# Patient Record
Sex: Female | Born: 1965 | Race: White | Hispanic: No | Marital: Married | Smoking: Current every day smoker
Health system: Southern US, Community
[De-identification: ages and names within clinical notes are randomized; demographics above are authoritative.]

## PROBLEM LIST (undated history)

## (undated) ENCOUNTER — Emergency Department (HOSPITAL_BASED_OUTPATIENT_CLINIC_OR_DEPARTMENT_OTHER): Payer: 59

## (undated) DIAGNOSIS — E559 Vitamin D deficiency, unspecified: Secondary | ICD-10-CM

## (undated) DIAGNOSIS — K648 Other hemorrhoids: Secondary | ICD-10-CM

## (undated) DIAGNOSIS — K824 Cholesterolosis of gallbladder: Secondary | ICD-10-CM

## (undated) DIAGNOSIS — K6289 Other specified diseases of anus and rectum: Secondary | ICD-10-CM

## (undated) DIAGNOSIS — Z8719 Personal history of other diseases of the digestive system: Secondary | ICD-10-CM

## (undated) DIAGNOSIS — N281 Cyst of kidney, acquired: Secondary | ICD-10-CM

## (undated) DIAGNOSIS — K219 Gastro-esophageal reflux disease without esophagitis: Secondary | ICD-10-CM

## (undated) DIAGNOSIS — E785 Hyperlipidemia, unspecified: Secondary | ICD-10-CM

## (undated) DIAGNOSIS — F419 Anxiety disorder, unspecified: Secondary | ICD-10-CM

## (undated) DIAGNOSIS — K5790 Diverticulosis of intestine, part unspecified, without perforation or abscess without bleeding: Secondary | ICD-10-CM

## (undated) DIAGNOSIS — I700 Atherosclerosis of aorta: Secondary | ICD-10-CM

## (undated) DIAGNOSIS — IMO0002 Reserved for concepts with insufficient information to code with codable children: Secondary | ICD-10-CM

## (undated) HISTORY — DX: Vitamin D deficiency, unspecified: E55.9

## (undated) HISTORY — DX: Cyst of kidney, acquired: N28.1

## (undated) HISTORY — DX: Personal history of other diseases of the digestive system: Z87.19

## (undated) HISTORY — DX: Reserved for concepts with insufficient information to code with codable children: IMO0002

## (undated) HISTORY — DX: Gastro-esophageal reflux disease without esophagitis: K21.9

## (undated) HISTORY — DX: Cholesterolosis of gallbladder: K82.4

## (undated) HISTORY — DX: Diverticulosis of intestine, part unspecified, without perforation or abscess without bleeding: K57.90

## (undated) HISTORY — PX: UPPER GASTROINTESTINAL ENDOSCOPY: SHX188

## (undated) HISTORY — DX: Other specified diseases of anus and rectum: K62.89

## (undated) HISTORY — PX: LUMBAR DISC SURGERY: SHX700

## (undated) HISTORY — DX: Atherosclerosis of aorta: I70.0

## (undated) HISTORY — DX: Anxiety disorder, unspecified: F41.9

## (undated) HISTORY — DX: Other hemorrhoids: K64.8

## (undated) HISTORY — DX: Hyperlipidemia, unspecified: E78.5

---

## 1998-09-25 ENCOUNTER — Encounter: Payer: Self-pay | Admitting: Internal Medicine

## 1998-09-25 ENCOUNTER — Ambulatory Visit (HOSPITAL_COMMUNITY): Admission: RE | Admit: 1998-09-25 | Discharge: 1998-09-25 | Payer: Self-pay | Admitting: Internal Medicine

## 1999-10-18 ENCOUNTER — Other Ambulatory Visit: Admission: RE | Admit: 1999-10-18 | Discharge: 1999-10-18 | Payer: Self-pay | Admitting: Obstetrics and Gynecology

## 2001-03-25 ENCOUNTER — Other Ambulatory Visit: Admission: RE | Admit: 2001-03-25 | Discharge: 2001-03-25 | Payer: Self-pay | Admitting: Obstetrics and Gynecology

## 2004-01-09 ENCOUNTER — Ambulatory Visit (HOSPITAL_COMMUNITY): Admission: RE | Admit: 2004-01-09 | Discharge: 2004-01-09 | Payer: Self-pay | Admitting: Obstetrics and Gynecology

## 2005-01-18 ENCOUNTER — Ambulatory Visit (HOSPITAL_COMMUNITY): Admission: RE | Admit: 2005-01-18 | Discharge: 2005-01-18 | Payer: Self-pay | Admitting: Obstetrics and Gynecology

## 2005-07-08 ENCOUNTER — Ambulatory Visit (HOSPITAL_COMMUNITY): Admission: RE | Admit: 2005-07-08 | Discharge: 2005-07-09 | Payer: Self-pay | Admitting: Neurosurgery

## 2008-07-26 ENCOUNTER — Emergency Department (HOSPITAL_BASED_OUTPATIENT_CLINIC_OR_DEPARTMENT_OTHER): Admission: EM | Admit: 2008-07-26 | Discharge: 2008-07-26 | Payer: Self-pay | Admitting: Emergency Medicine

## 2009-06-26 ENCOUNTER — Encounter: Payer: Self-pay | Admitting: Gastroenterology

## 2009-07-05 ENCOUNTER — Encounter: Payer: Self-pay | Admitting: Gastroenterology

## 2009-07-05 ENCOUNTER — Ambulatory Visit (HOSPITAL_COMMUNITY): Admission: RE | Admit: 2009-07-05 | Discharge: 2009-07-05 | Payer: Self-pay | Admitting: Internal Medicine

## 2009-07-28 ENCOUNTER — Ambulatory Visit: Payer: Self-pay | Admitting: Gastroenterology

## 2009-07-28 DIAGNOSIS — R197 Diarrhea, unspecified: Secondary | ICD-10-CM | POA: Insufficient documentation

## 2009-08-10 ENCOUNTER — Encounter (INDEPENDENT_AMBULATORY_CARE_PROVIDER_SITE_OTHER): Payer: Self-pay

## 2009-08-18 ENCOUNTER — Ambulatory Visit: Payer: Self-pay | Admitting: Gastroenterology

## 2009-08-18 LAB — CONVERTED CEMR LAB
Fecal Occult Blood: NEGATIVE
OCCULT 1: NEGATIVE
OCCULT 2: NEGATIVE
OCCULT 3: NEGATIVE
OCCULT 4: NEGATIVE
OCCULT 5: NEGATIVE

## 2009-08-23 ENCOUNTER — Ambulatory Visit: Payer: Self-pay | Admitting: Gastroenterology

## 2009-12-05 ENCOUNTER — Encounter: Admission: RE | Admit: 2009-12-05 | Discharge: 2009-12-05 | Payer: Self-pay | Admitting: Specialist

## 2010-11-13 NOTE — Letter (Signed)
Summary: CMA Hemoccult Letter  [facility]  [address] [place], [place] [address]   Phone: [phone]  Fax: [phone]         August 10, 2009 MRN: [id number]    [facility] [address] [place], [place]  [address]    Dear Ms. Janosik,     Despite repeated attempts, I have been unable to reach you by phone. At your last visit, Dr Russella Dar requested that you complete the hemoccult cards given to you at your last visit. However, as of yet, we have not received them. Please follow the instructions on the inside cover and return them as soon as possible.If you have misplaced the hemoccult cards, please call me at [phone] and I will mail you new cards. Your health is very important to [place].These tests will help ensure that Dr. Russella Dar has all the information at his disposal to make a complete diagnosis for you.  Thank you for your prompt attention to this matter.   Sincerely,    Darcey Nora RN, CGRN

## 2010-11-13 NOTE — Assessment & Plan Note (Signed)
Summary: f/u diarrhea   History of Present Illness Visit Type: Follow-up Visit Primary GI MD: Elie Goody MD [facility] Primary Provider: Shela Nevin Requesting Provider: Shela Nevin Chief Complaint: diarrhea, some improvement History of Present Illness:   Laura Kelley returns for followup today, with a substantial improvement in her diarrhea after completing a course of Cipro and Flagyl. She now reports 1-2 semi-formed stools per day. She feels she is under significant stress caring for both her parents with chronic illnesses, and when traveling on a weekend did get away this weekend with her husband. She noted mild constipation. She notes that her appetite and weight had stabilized. She relates that her brother has hemochromatosis. She has twins sisters who are carriers for the gene. She states she had genetic testing performed, which showed she does not carry a copy of the abnormal gene.   GI Review of Systems      Denies abdominal pain, acid reflux, belching, bloating, chest pain, dysphagia with liquids, dysphagia with solids, heartburn, loss of appetite, nausea, vomiting, vomiting blood, weight loss, and  weight gain.      Reports diarrhea.     Denies anal fissure, black tarry stools, change in bowel habit, constipation, diverticulosis, fecal incontinence, heme positive stool, hemorrhoids, irritable bowel syndrome, jaundice, light color stool, liver problems, rectal bleeding, and  rectal pain.   Current Medications (verified): 1)  None  Allergies (verified): No Known Drug Allergies  Past History:  Past Medical History: Reviewed history from 07/28/2009 and no changes required. Sleep Apnea  Past Surgical History: Reviewed history from 07/28/2009 and no changes required. 2 c-sections lower back surgery  Family History: Reviewed history from 07/28/2009 and no changes required. Family History of Ovarian Cancer:aunt No FH of Colon Cancer: Brother with  hemochromatosis  Social History: Reviewed history from 07/28/2009 and no changes required. married 2 children Occupation: Air cabin crew Patient currently smokes. 10 ciggs per day Alcohol Use - yes  2 weekly Daily Caffeine Use 2 cups coffee per day Illicit Drug Use - no  Review of Systems       The patient complains of allergy/sinus, depression-new, fatigue, headaches-new, menstrual pain, night sweats, and sleeping problems.         The pertinent positives and negatives are noted as above and in the HPI. All other ROS were reviewed and were negative.   Vital Signs:  Patient profile:   45 year old female Height:      60 inches Weight:      164.50 pounds BMI:     32.24 Pulse rate:   60 / minute Pulse rhythm:   regular BP sitting:   100 / 68  (left arm) Cuff size:   regular  Vitals Entered By: June McMurray CMA Duncan Dull) (August 23, 2009 8:34 AM)  Physical Exam  General:  Well developed, well nourished, no acute distress. Head:  Normocephalic and atraumatic. Eyes:  PERRLA, no icterus. Mouth:  No deformity or lesions, dentition normal. Lungs:  Clear throughout to auscultation. Heart:  Regular rate and rhythm; no murmurs, rubs,  or bruits. Abdomen:  Soft, nontender and nondistended. No masses, hepatosplenomegaly or hernias noted. Normal bowel sounds. Psych:  Alert and cooperative. Normal mood and affect.  Impression & Recommendations:  Problem # 1:  DIARRHEA (ICD-787.91) Her diarrhea has essentially resolved. Probiotic for one month. A component of her diarrhea may be related to stress. If her diarrhea or weight loss persists she will return for further evaluation to include colonoscopy.  Problem #  2:  LOSS OF WEIGHT (ICD-783.21) Her weight is stabilized.  Patient Instructions: 1)  Please take Align 1 tablet daily. (You have been given samples) 2)  Follow up as needed for your gastroentestinal concerns. 3)  Copy sent to : Lucky Cowboy, MD 4)  The medication list  was reviewed and reconciled.  All changed / newly prescribed medications were explained.  A complete medication list was provided to the patient / caregiver.  Appended Document: f/u diarrhea    Clinical Lists Changes  Observations: Added new observation of COLONNXTDUE: 01/12/2014 (08/23/2009 9:15)

## 2010-11-13 NOTE — Assessment & Plan Note (Signed)
Summary: CHRONIC DIARRHEA/YF   History of Present Illness Visit Type: Initial Consult Primary GI MD: Elie Goody MD [facility] Primary Provider: Shela Nevin Requesting Provider: Shela Nevin Chief Complaint: Chronic diarrhea x 4 months, neg for blood History of Present Illness:   This is a 45 year old female who relates a 4 month history of diarrhea. She relates nonbloody, loose stools occurring between 1 and 5 times a day. Symptoms began at the same time she has had increased stress in her life. She notes generalized crampy, abdominal pain that is relieved by diarrhea. She states she had blood work performed Dr. Kathryne Sharper office that was unremarkable, including a celiac panel. She recently had ultrasound showed small uterine fibroids, but no other abnormalities. She notes about a 10 pound weight loss over the past 4 months. She tried a lactose-free diet for 2 weeks with no improvement in symptoms. She cannot correlate any specific food to her symptoms. She denies any recent antibiotic usage. She has rare episodes of acid reflux, which are related to being late at night.   GI Review of Systems    Reports abdominal pain, acid reflux, belching, bloating, heartburn, nausea, and  weight loss.     Location of  Abdominal pain: generalized. Weight loss of 10 pounds   Denies chest pain, dysphagia with liquids, dysphagia with solids, loss of appetite, vomiting, vomiting blood, and  weight gain.      Reports change in bowel habits and  diarrhea.     Denies anal fissure, black tarry stools, constipation, diverticulosis, fecal incontinence, heme positive stool, hemorrhoids, irritable bowel syndrome, jaundice, light color stool, liver problems, rectal bleeding, and  rectal pain.   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 10 ciggs per day Alcohol Use - yes  2 weekly Daily Caffeine Use 2 cups coffee per day Illicit Drug Use - no Smoking Status:  current Drug Use:  no  Review of Systems       The patient complains of allergy/sinus, anxiety-new, depression-new, fatigue, menstrual pain, muscle pains/cramps, night sweats, and sleeping problems.         The pertinent positives and negatives are noted as above and in the HPI. All other ROS were reviewed and were negative.   Vital Signs:  Patient profile:   45 year old female Height:      60 inches Weight:      162 pounds BMI:     31.75 BSA:     1.71 Pulse rate:   100 / minute Pulse rhythm:   regular BP sitting:   112 / 78  (left arm)  Vitals Entered By: Merri Ray CMA (AAMA) (July 28, 2009 9:16 AM)  Physical Exam  General:  Well developed, well nourished, no acute distress. Head:  Normocephalic and atraumatic. Eyes:  PERRLA, no icterus. Ears:  Normal auditory acuity. Mouth:  No deformity or lesions, dentition normal. Neck:  Supple; no masses or thyromegaly. Lungs:  Clear throughout to auscultation. Heart:  Regular rate and rhythm; no murmurs, rubs,  or bruits. Abdomen:  Soft, nontender and nondistended. No masses, hepatosplenomegaly or hernias noted. Normal bowel sounds. Rectal:  deferred until time of colonoscopy.   Msk:  Symmetrical with no gross deformities.  Normal posture. Pulses:  Normal pulses noted. Extremities:  No clubbing, cyanosis, edema or deformities noted. Neurologic:  Alert and  oriented x4;  grossly normal neurologically. Cervical Nodes:  No significant cervical adenopathy. Inguinal Nodes:  No significant inguinal adenopathy. Psych:  Alert and cooperative. Normal mood and affect.  Impression & Recommendations:  Problem # 1:  DIARRHEA  (ICD-787.91) Chronic diarrhea with a variable pattern. This has been associated with weight loss an increased stress. Rule out irritable bowel syndrome, inflammatory bowel disease, colorectal neoplasms, and chronic intestinal infections. Obtain blood work from Dr. Kathryne Sharper office. Trial of Cipro and Flagyl for 7 days. If her symptoms do not completely respond, proceed with colonoscopy for further evaluation. Orders: [facility] Hemoccult Cards #3 (take home) (Hem cards #3)  Problem # 2:  LOSS OF WEIGHT (ICD-783.21) As above. Orders: [facility] Hemoccult Cards #3 (take home) (Hem cards #3)  Patient Instructions: 1)  Follow instructions on Hemoccult cards and mail back to [place].  2)  Pick up prescriptions at your pharmacy.  3)  Please schedule a follow-up appointment in 2 weeks.  4)  Copy sent to : Lucky Cowboy, MD 5)  The medication list was reviewed and reconciled.  All changed / newly prescribed medications were explained.  A complete medication list was provided to the patient / caregiver.  Prescriptions: FLAGYL 500 MG TABS (METRONIDAZOLE) one tablet by mouth two times a day  #14 x 0   Entered by:   Christie Nottingham CMA (AAMA)   Authorized by:   Meryl Dare MD [facility]   Signed by:   Meryl Dare MD FACG on 07/28/2009   Method used:   Electronically to        [facility] [phone]* (retail)       [address]       [place], [place]  [address]       Ph: [phone] or [phone]       Fax: [phone]   RxID:   [id number] CIPRO 500 MG TABS (CIPROFLOXACIN HCL) one tablet by mouth two times a day  #14 x 0   Entered by:   Christie Nottingham CMA (AAMA)   Authorized by:   Meryl Dare MD [facility]   Signed by:   Meryl Dare MD FACG on 07/28/2009   Method used:   Electronically to        [phone][facility] (retail)       [address]       [place], [place]  [address]       Ph: [phone] or [phone]       Fax: [phone]   RxID:   [id number]

## 2011-03-01 NOTE — Op Note (Signed)
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   OPERATION PERFORMED:  Right L3 hemilaminotomy and microdiskectomy with  microdissection.   SURGEON:  Hewitt Shorts, M.D.   ASSISTANT:  Danae Orleans. Venetia Maxon, M.D.   ANESTHESIA:  General endotracheal.   INDICATIONS FOR PROCEDURE:  The patient is a 45 year old woman who presented  with a right L3 radiculopathy with weakness of the right iliopsoas and  quadriceps muscle who was found to have an L3-4 disk herniation that had  migrated rostrally behind the body of L3.  Decision was made to proceed with  elective laminotomy and microdiskectomy.   DESCRIPTION OF PROCEDURE:  The patient was brought to the operating room and  placed under general endotracheal anesthesia.  The patient was turned to a  prone position.  Lumbar region was prepped with Betadine soap and solution  and draped in sterile fashion.  The midline was infiltrated with local  anesthetic with epinephrine.  X-ray was taken, the L3-4 level identified and  a midline incision was made and carried down to the subcutaneous tissue.  Bipolar cautery and electrocautery were used to maintain hemostasis.  Dissection was carried down to the lumbar fascia which was incised on the  right side of the midline in the paraspinal muscles.  We dissected the  spinous process and lamina in  subperiosteal fashion.  A number of x-rays  were taken and we identified the L3 lamina as well as the L2-3 and L3-4  interlaminar spaces.  The operating microscope was draped and brought into  the field to provide additional magnification, illumination and  visualization and the decompression was performed using microdissection and  microsurgical technique. A hemilaminotomy on the right side of L3 was  performed using the XMax drill and Kerrison punches.  The ligamentum flavum  was removed and we identified the thecal sac and exiting nerve roots.  Specifically we identified the exiting right L3 and L4 nerve roots.  We then  gently retracted the thecal sac medially exposing the L3-4 disk annulus.  We  identified the L3 nerve roots and the pedicle of L3 and examined the  epidural space.  There appeared to be a subligamentous disk herniation  present.  We incised the remaining annular fibers and proceeded with a  diskectomy using a variety of pituitary rongeurs and microcurettes.  There  was spondylitic overgrowth on the posterior aspect of the L3-4 disk level  and this was removed using an osteophyte removal tool.  Also the ligamentum  flavum was thickened in the  medial aspect of the neural foramen and this was  carefully removed as well.  We were able to decompress the spinal canal,  epidural space as well as the neural foramen and specifically decompress the  exiting L3 and L4 nerve roots as well as the thecal sac.  In the end, all  loose fragments of disk material were removed from both the disk space and  the epidural space.  Once the decompression was completed, hemostasis was  established with the use of Gelfoam soaked in thrombin.  All the Gelfoam was  removed prior to closure.  We then instilled 2 mL of fentanyl and 80 mg of  DepoMedrol.  However, we actually had to aspirate  much of that back up  because there were small points of bleeding which was coagulated.  We then  proceeded  with closure.  The deep fascia was closed with interrupted #1  undyed Vicryl sutures, Scarpa's fascia was closed with interrupted undyed #1  Vicryl sutures, the subcutaneous and subcuticular layer were closed with  interrupted inverted 2-0 undyed Vicryl sutures and skin edges closed with  Dermabond.  The patient tolerated the procedure well.  The estimated blood  loss for this procedure was 50 mL.  Sponge, needle and instrument counts  were correct.  Following surgery the patient was to be turned back to supine  position to be reversed from anesthetic, extubated and transferred to  recovery room for further care.      Hewitt Shorts, M.D.  Electronically Signed     RWN/MEDQ  D:  07/08/2005  T:  07/09/2005  Job:  [id number]

## 2011-07-16 LAB — COMPREHENSIVE METABOLIC PANEL
ALT: 6
AST: 17
Albumin: 3.9
Alkaline Phosphatase: 76
BUN: 11
CO2: 22
Calcium: 8.7
Chloride: 110
Creatinine, Ser: 0.6
GFR calc Af Amer: >60
GFR calc non Af Amer: >60
Glucose, Bld: 80
Potassium: 4.1
Sodium: 139
Total Bilirubin: 0.3
Total Protein: 6.7

## 2011-07-16 LAB — DIFFERENTIAL
Basophils Absolute: 0
Basophils Relative: 0
Eosinophils Absolute: 0.3
Eosinophils Relative: 3
Lymphocytes Relative: 21
Lymphs Abs: 2.3
Monocytes Absolute: 0.8
Monocytes Relative: 8
Neutro Abs: 7.4
Neutrophils Relative %: 68

## 2011-07-16 LAB — URINALYSIS, ROUTINE W REFLEX MICROSCOPIC
Bilirubin Urine: NEGATIVE
Glucose, UA: NEGATIVE
Hgb urine dipstick: NEGATIVE
Ketones, ur: NEGATIVE
Nitrite: NEGATIVE
Protein, ur: NEGATIVE
Specific Gravity, Urine: 1.018
Urobilinogen, UA: 0.2
pH: 5

## 2011-07-16 LAB — CBC
HCT: 38.3
Hemoglobin: 12.8
MCHC: 33.4
MCV: 89.4
Platelets: 364
RBC: 4.29
RDW: 13.5
WBC: 10.8 — ABNORMAL HIGH

## 2011-07-16 LAB — LIPASE, BLOOD: Lipase: 103

## 2011-10-15 HISTORY — PX: COLONOSCOPY: SHX174

## 2012-05-06 ENCOUNTER — Ambulatory Visit (HOSPITAL_COMMUNITY)
Admission: RE | Admit: 2012-05-06 | Discharge: 2012-05-06 | Disposition: A | Discharge status: Home / Self Care | Payer: 59 | Source: Ambulatory Visit | Attending: Internal Medicine | Admitting: Internal Medicine

## 2012-05-06 ENCOUNTER — Other Ambulatory Visit (HOSPITAL_COMMUNITY): Payer: Self-pay | Admitting: Internal Medicine

## 2012-05-06 DIAGNOSIS — R1011 Right upper quadrant pain: Secondary | ICD-10-CM | POA: Insufficient documentation

## 2012-05-06 DIAGNOSIS — Q619 Cystic kidney disease, unspecified: Secondary | ICD-10-CM | POA: Insufficient documentation

## 2012-05-06 DIAGNOSIS — K824 Cholesterolosis of gallbladder: Secondary | ICD-10-CM | POA: Insufficient documentation

## 2012-05-06 DIAGNOSIS — K7689 Other specified diseases of liver: Secondary | ICD-10-CM | POA: Insufficient documentation

## 2012-05-07 ENCOUNTER — Telehealth: Payer: Self-pay | Admitting: Gastroenterology

## 2012-05-07 NOTE — Telephone Encounter (Signed)
Last OV with Dr Russella Dar 09/02/2009; no procedures in EPIC and I have sent for the chart.

## 2012-05-08 NOTE — Telephone Encounter (Signed)
Laura Kelley reports pt has severe abdominal pain that doubles her over. She does not want to go to the ER because she feels they will just send her home. Pt had an U/S on 05/06/12 that showed no gallstones, but with polyps. Pt given an appt 05/11/12 at 2:30am.

## 2012-05-11 ENCOUNTER — Encounter: Payer: Self-pay | Admitting: Nurse Practitioner

## 2012-05-11 ENCOUNTER — Ambulatory Visit (INDEPENDENT_AMBULATORY_CARE_PROVIDER_SITE_OTHER): Payer: 59 | Admitting: Nurse Practitioner

## 2012-05-11 VITALS — BP 120/70 | HR 75 | Ht 60.0 in | Wt 165.2 lb

## 2012-05-11 DIAGNOSIS — R198 Other specified symptoms and signs involving the digestive system and abdomen: Secondary | ICD-10-CM

## 2012-05-11 DIAGNOSIS — Z8371 Family history of colonic polyps: Secondary | ICD-10-CM

## 2012-05-11 DIAGNOSIS — R140 Abdominal distension (gaseous): Secondary | ICD-10-CM

## 2012-05-11 DIAGNOSIS — R141 Gas pain: Secondary | ICD-10-CM

## 2012-05-11 DIAGNOSIS — R1010 Upper abdominal pain, unspecified: Secondary | ICD-10-CM

## 2012-05-11 DIAGNOSIS — R143 Flatulence: Secondary | ICD-10-CM

## 2012-05-11 DIAGNOSIS — Z83719 Family history of colon polyps, unspecified: Secondary | ICD-10-CM | POA: Insufficient documentation

## 2012-05-11 DIAGNOSIS — R109 Unspecified abdominal pain: Secondary | ICD-10-CM

## 2012-05-11 MED ORDER — DICYCLOMINE HCL 10 MG PO CAPS: ORAL_CAPSULE | ORAL | Status: DC

## 2012-05-11 MED ORDER — MOVIPREP 100 G PO SOLR: 1.0000 | Freq: Once | ORAL | Status: AC

## 2012-05-11 MED ORDER — ESOMEPRAZOLE MAGNESIUM 40 MG PO PACK: 40.0000 mg | PACK | Freq: Every day | ORAL | Status: DC

## 2012-05-11 NOTE — Patient Instructions (Addendum)
We [facility] for Nexium, samples provided, and Bentyl ( Dicyclomine)  And the colonoscopy prep, Moviprep. We scheduled the colonoscopy on 06-16-2012. Directions provided.  Colonoscopy A colonoscopy is an exam to evaluate your entire colon. In this exam, your colon is cleansed. A long fiberoptic tube is inserted through your rectum and into your colon. The fiberoptic scope (endoscope) is a long bundle of enclosed and very flexible fibers. These fibers transmit light to the area examined and send images from that area to your caregiver. Discomfort is usually minimal. You may be given a drug to help you sleep (sedative) during or prior to the procedure. This exam helps to detect lumps (tumors), polyps, inflammation, and areas of bleeding. Your caregiver may also take a small piece of tissue (biopsy) that will be examined under a microscope. LET YOUR CAREGIVER KNOW ABOUT:   Allergies to food or medicine.   Medicines taken, including vitamins, herbs, eyedrops, over-the-counter medicines, and creams.   Use of steroids (by mouth or creams).   Previous problems with anesthetics or numbing medicines.   History of bleeding problems or blood clots.   Previous surgery.   Other health problems, including diabetes and kidney problems.   Possibility of pregnancy, if this applies.  BEFORE THE PROCEDURE   A clear liquid diet may be required for 2 days before the exam.   Ask your caregiver about changing or stopping your regular medications.   Liquid injections (enemas) or laxatives may be required.   A large amount of electrolyte solution may be given to you to drink over a short period of time. This solution is used to clean out your colon.   You should be present 60 minutes prior to your procedure or as directed by your caregiver.  AFTER THE PROCEDURE   If you received a sedative or pain relieving medication, you will need to arrange for someone to drive you home.    Occasionally, there is a little blood passed with the first bowel movement. Do not be concerned.  FINDING OUT THE RESULTS OF YOUR TEST Not all test results are available during your visit. If your test results are not back during the visit, make an appointment with your caregiver to find out the results. Do not assume everything is normal if you have not heard from your caregiver or the medical facility. It is important for you to follow up on all of your test results. HOME CARE INSTRUCTIONS   It is not unusual to pass moderate amounts of gas and experience mild abdominal cramping following the procedure. This is due to air being used to inflate your colon during the exam. Walking or a warm pack on your belly (abdomen) may help.   You may resume all normal meals and activities after sedatives and medicines have worn off.   Only take over-the-counter or prescription medicines for pain, discomfort, or fever as directed by your caregiver. Do not use aspirin or blood thinners if a biopsy was taken. Consult your caregiver for medicine usage if biopsies were taken.  SEEK IMMEDIATE MEDICAL CARE IF:   You have a fever.   You pass large blood clots or fill a toilet with blood following the procedure. This may also occur 10 to 14 days following the procedure. This is more likely if a biopsy was taken.   You develop abdominal pain that keeps getting worse and cannot be relieved with medicine.  Document Released: 09/27/2000 Document Revised: 09/19/2011 Document Reviewed: 05/12/2008  [employer] Patient Information 2012 [employer] [place].

## 2012-05-11 NOTE — Progress Notes (Signed)
05/11/2012 Laura Kelley [id number] 04-16-1966   HISTORY OF PRESENT ILLNESS:  Patient is a 46 year old female who was seen by Dr. Russella Dar in November 2010 for evaluation of diarrhea. Her symptoms improved on Cipro and Flagyl.  Patient worked in today for evaluation of bowel changes and abdominal pain. Patient has had chronic, painless diarrhea for years. Over the last two months she has had intermittent constipation and abdominal pain. She has an intermittent gnawing pain in RUQ which can radiate around to her mid back and seems to be worse during periods of constipation. She complains of gas and bloating. Patient stopped fatty foods several days ago and does feels better but difficult to sort out as pain has been intermittent anyway.  PCP got ultrasound which showed gallbladder polyps. No findings of cholecystitis. I reviewed labs from primary care physician's office drawn 05/07/12. CBC was normal with hemoglobin of 13 point 2. Electrolytes were normal. Liver function studies were normal.  History reviewed. No pertinent past medical history. Past Surgical History  Procedure Date  . Lumbar disc surgery     Dr. Bettina Gavia  . Cesarean section 1985, 1993    x 2    reports that she has been smoking Cigarettes.  She has a 8.5 pack-year smoking history. She has never used smokeless tobacco. She reports that she drinks alcohol. She reports that she does not use illicit drugs. family history includes Colon polyps in her mother and Multiple sclerosis in her mother.  There is no history of Colon cancer. Allergies  Allergen Reactions  . Other     Stitches used after growth on back removed, caused redness and itching      Outpatient Encounter Prescriptions as of 05/11/2012  Medication Sig Dispense Refill  . Ibuprofen 200 MG CAPS Take by mouth as needed.         REVIEW OF SYSTEMS  : Positive for sinus trouble and menstrual pain. All other systems reviewed and negative except where noted in the  History of Present Illness.   PHYSICAL EXAM: BP 120/70  Pulse 75  Ht 5' (1.524 m)  Wt 165 lb 3.2 oz (74.934 kg)  BMI 32.26 kg/m2  LMP 04/25/2012 General: Well developed white female in no acute distress Head: Normocephalic and atraumatic Eyes:  sclerae anicteric,conjunctive pink. Ears: Normal auditory acuity Mouth: No deformity or lesions Neck: Supple, no masses.  Lungs: Clear throughout to auscultation Heart: Regular rate and rhythm; no murmurs heard Abdomen: Soft, non distended, moderate RUQ tenderness, mild epigastric tenderness. No masses or hepatomegaly noted. Normal Bowel sounds Rectal: not done Musculoskeletal: Symmetrical with no gross deformities  Skin: No lesions on visible extremities Extremities: No edema or deformities noted Neurological: Alert oriented x 4, grossly nonfocal Cervical Nodes:  No significant cervical adenopathy Psychological:  Alert and cooperative. Normal mood and affect  ASSESSMENT AND PLAN:  1.  Intermittent radiating epigastric /right upper quadrant pain and bloating of at least two months duration. Pain seems to be better with elimination of fatty food. Patient has also had some associated bowel changes. She usually has chronic loose stool, now having some occasional constipation. Multiple GI issues, some of which sound like gallbladder disease though no evidence for gallstones or cholecystitis on ultrasound. Rule out peptic ulcer disease. First, for the the bloating and gas will try a course of align .Will add a daily PPI before breakfast. Patient will be scheduled for upper endoscopy to be done in 3-4 weeks' time. If she feels much better with the above  interventions then we can hold off on EGD. If no improvement in symptoms and upper endoscopy is negative, consider HIDA scan.   2. Bowel change, see #2. Patient has chronic loose stool, now having intermittent constipation. Suspect this is functional but will evaluate further at time of colonoscopy  (see #3) I don't think fiber would be a good idea at this time as it may exacerbate her bloating. Patient has developed right upper quadrant pain with these bowel changes. Will try Bentyl twice daily, cautioned her about the potential for constipation with this medication.   3. [facility] of colon polyps in mother. Patient told by mother's GI doctor that she should not wait until age 30 to get her screening colonoscopy done. It is reasonable to proceed with colonoscopy given family history of colon polyps as well as patient's recent bowel changes. The risks, benefits, and alternatives to colonoscopy with possible biopsy and possible polypectomy were discussed with the patient and they consent to proceed.   4. Abnormal ultrasound revealing gallbladder polyps, small   amount of debris,  no well defined gallstones. Normal LFTs

## 2012-05-11 NOTE — Progress Notes (Signed)
Reviewed and agree with management plans.  Fredi Hurtado T. Michiko Lineman MD FACG  

## 2012-05-12 ENCOUNTER — Encounter: Payer: Self-pay | Admitting: Gastroenterology

## 2012-06-01 ENCOUNTER — Other Ambulatory Visit (INDEPENDENT_AMBULATORY_CARE_PROVIDER_SITE_OTHER): Payer: 59

## 2012-06-01 ENCOUNTER — Telehealth: Payer: Self-pay | Admitting: Gastroenterology

## 2012-06-01 ENCOUNTER — Encounter: Payer: Self-pay | Admitting: Nurse Practitioner

## 2012-06-01 ENCOUNTER — Ambulatory Visit (INDEPENDENT_AMBULATORY_CARE_PROVIDER_SITE_OTHER): Payer: 59 | Admitting: Nurse Practitioner

## 2012-06-01 VITALS — BP 140/60 | HR 133 | Ht 60.0 in | Wt 158.8 lb

## 2012-06-01 DIAGNOSIS — R140 Abdominal distension (gaseous): Secondary | ICD-10-CM

## 2012-06-01 DIAGNOSIS — R109 Unspecified abdominal pain: Secondary | ICD-10-CM

## 2012-06-01 DIAGNOSIS — R141 Gas pain: Secondary | ICD-10-CM

## 2012-06-01 DIAGNOSIS — Z8371 Family history of colonic polyps: Secondary | ICD-10-CM

## 2012-06-01 DIAGNOSIS — R142 Eructation: Secondary | ICD-10-CM

## 2012-06-01 DIAGNOSIS — R198 Other specified symptoms and signs involving the digestive system and abdomen: Secondary | ICD-10-CM

## 2012-06-01 DIAGNOSIS — R1010 Upper abdominal pain, unspecified: Secondary | ICD-10-CM

## 2012-06-01 LAB — CBC
HCT: 41.6 % (ref 36.0–46.0)
Hemoglobin: 13.7 g/dL (ref 12.0–15.0)
MCHC: 32.9 g/dL (ref 30.0–36.0)
MCV: 92.1 fl (ref 78.0–100.0)
Platelets: 395 10*3/uL (ref 150.0–400.0)
RBC: 4.52 Mil/uL (ref 3.87–5.11)
RDW: 15.2 % — ABNORMAL HIGH (ref 11.5–14.6)
WBC: 12.4 10*3/uL — ABNORMAL HIGH (ref 4.5–10.5)

## 2012-06-01 LAB — HEPATIC FUNCTION PANEL
ALT: 12 U/L (ref 0–35)
AST: 13 U/L (ref 0–37)
Albumin: 4.2 g/dL (ref 3.5–5.2)
Alkaline Phosphatase: 70 U/L (ref 39–117)
Bilirubin, Direct: 0.1 mg/dL (ref 0.0–0.3)
Total Bilirubin: 0.5 mg/dL (ref 0.3–1.2)
Total Protein: 7.3 g/dL (ref 6.0–8.3)

## 2012-06-01 MED ORDER — TRAMADOL HCL 50 MG PO TABS: 50.0000 mg | ORAL_TABLET | Freq: Four times a day (QID) | ORAL | Status: AC | PRN

## 2012-06-01 MED ORDER — ESOMEPRAZOLE MAGNESIUM 40 MG PO PACK: 40.0000 mg | PACK | Freq: Every day | ORAL | Status: DC

## 2012-06-01 NOTE — Telephone Encounter (Signed)
She needs evaluation in ED, urgent care center or in office today with [place] or PCP. OK to use Bentyl for now and clear liquids.

## 2012-06-01 NOTE — Telephone Encounter (Signed)
Patient reports a T max of 99, chills and cold.  She reports since Friday she has had abdominal pain "right underneath my right rib cage".  She reports that she also has back pain and nausea. The pain doubles her over.  She reports that pain started late Friday night and lasted until 3:00 am on Sat and returned at 6:00 on Sat at about 6 am.  She states that eating aggravates her symptoms and causes severe pain.  She was prescribed dicyclomine at her last office visit with Willette Cluster RNP she has not tried this. I have asked her to take some now until I can have this reviewed by the MD.  "She keeps asking do I just need to go on to the ER?  Dr. Russella Dar please review and advise

## 2012-06-01 NOTE — Telephone Encounter (Signed)
Patient will come in and see Willette Cluster RNP today at 2:30

## 2012-06-01 NOTE — Patient Instructions (Addendum)
We have sent in your medication to your pharmacy We have given you Carafate samples to take four times a day. Before meals and at bedtime Increase your Nexium to twice a day. We scheduled the Endoscopy at [facility] with Dr. Melene Muller for Wed. 06-03-2012. Directions provided.

## 2012-06-02 ENCOUNTER — Encounter: Payer: Self-pay | Admitting: Nurse Practitioner

## 2012-06-02 NOTE — Progress Notes (Signed)
Laura Kelley [id number] 13-Oct-1966   HISTORY OR PRESENT ILLNESS :  Laura Kelley is a 46 year old female who I saw in late July of this year for abdominal pain and bowel changes. Patient had a history of chronic diarrhea but was reporting constipation and right upper quadrant pain when I saw her in July. An ultrasound showed gallbladder polyps, some debris but no definite stones. Labs drawn her primary cares office late July were normal. Patient's right upper quadrant discomfort was suspicious for gallbladder disease, especially since it was worse with fatty food intake. Prior to further gallbladder workup however, patient was scheduled for upper endoscopy to rule out other etiologies of pain such as peptic ulcer disease. She was given a PPI to try and scheduled for upper endoscopy to be done sometime in September. She was also given a trial of Bentyl for pain.  Neither the PPI nor Bentyl helped. Patient has continued to have intermittent right upper quadrant pain which has now begun radiating through to her right upper back. Over the weekend she had low grade fevers. Her weight is down about 7 pounds.  Current Medications, Allergies, Past Medical History, Past Surgical History, Family History and Social History were reviewed in [employer] record.   PHYSICAL EXAMINATION : General:  Well developed  female in no acute distress Head: Normocephalic and atraumatic Eyes:  sclerae anicteric,conjunctive pink. Ears: Normal auditory acuity Neck: Supple, no masses.  Lungs: Clear throughout to auscultation Heart: Regular rate and rhythm; no murmurs heard Abdomen: Soft, nondistended, moderate right upper quadrant tenderness . No masses or hepatomegaly noted. Normal bowel sounds Musculoskeletal: Symmetrical with no gross deformities  Skin: No lesions on visible extremities Extremities: No edema or deformities noted Neurological: Oriented x 4, grossly nonfocal Cervical Nodes:  No  significant cervical adenopathy Psychological:  Alert and cooperative. Normal mood and affect  ASSESSMENT AND PLAN :   1. Progressive right upper quadrant pain, now radiating through to her right upper back. She has been having low-grade fevers for the last couple of days. For unclear reasons this pain seems to be worse during times of constipation. I am still concerned about the possibility of gallbladder disease. Patient is scheduled for upper endoscopy in a few weeks, I have moved her procedure to this week.  In the meantime, she will take twice daily PPI and I gave her some samples of Carafate to try 4 times daily as well. If upper endoscopy is normal, consider HIDA scan and/ or referral to surgery for further evaluation of gallbladder.  2. family history of colon polyps. No blood in stool, she is not anemic.  Patient was to have a colonoscopy at the time of her upper endoscopy next month. It has become necessary to proceed earlier with upper endoscopy, her colonoscopy date will remain the same.  3. Bowel change, intermittent constipation. Proceed with colonoscopy 06/16/12 as previously planned for evaluation of bowel change and family history of colon polyps.

## 2012-06-03 ENCOUNTER — Ambulatory Visit (HOSPITAL_COMMUNITY)
Admission: RE | Admit: 2012-06-03 | Discharge: 2012-06-03 | Disposition: A | Discharge status: Home / Self Care | Payer: 59 | Source: Ambulatory Visit | Attending: Gastroenterology | Admitting: Gastroenterology

## 2012-06-03 ENCOUNTER — Encounter (HOSPITAL_COMMUNITY)
Admission: RE | Disposition: A | Discharge status: Home / Self Care | Payer: Self-pay | Source: Ambulatory Visit | Attending: Gastroenterology

## 2012-06-03 ENCOUNTER — Encounter (HOSPITAL_COMMUNITY): Payer: Self-pay | Admitting: Gastroenterology

## 2012-06-03 DIAGNOSIS — Z8371 Family history of colonic polyps: Secondary | ICD-10-CM

## 2012-06-03 DIAGNOSIS — R1010 Upper abdominal pain, unspecified: Secondary | ICD-10-CM

## 2012-06-03 DIAGNOSIS — A048 Other specified bacterial intestinal infections: Secondary | ICD-10-CM | POA: Insufficient documentation

## 2012-06-03 DIAGNOSIS — K2980 Duodenitis without bleeding: Secondary | ICD-10-CM | POA: Insufficient documentation

## 2012-06-03 DIAGNOSIS — K269 Duodenal ulcer, unspecified as acute or chronic, without hemorrhage or perforation: Secondary | ICD-10-CM

## 2012-06-03 DIAGNOSIS — R140 Abdominal distension (gaseous): Secondary | ICD-10-CM

## 2012-06-03 DIAGNOSIS — K2990 Gastroduodenitis, unspecified, without bleeding: Secondary | ICD-10-CM

## 2012-06-03 DIAGNOSIS — Z8719 Personal history of other diseases of the digestive system: Secondary | ICD-10-CM

## 2012-06-03 DIAGNOSIS — R079 Chest pain, unspecified: Secondary | ICD-10-CM | POA: Insufficient documentation

## 2012-06-03 DIAGNOSIS — K2970 Gastritis, unspecified, without bleeding: Secondary | ICD-10-CM

## 2012-06-03 DIAGNOSIS — R198 Other specified symptoms and signs involving the digestive system and abdomen: Secondary | ICD-10-CM

## 2012-06-03 DIAGNOSIS — R109 Unspecified abdominal pain: Secondary | ICD-10-CM | POA: Insufficient documentation

## 2012-06-03 DIAGNOSIS — K2940 Chronic atrophic gastritis without bleeding: Secondary | ICD-10-CM | POA: Insufficient documentation

## 2012-06-03 HISTORY — PX: ESOPHAGOGASTRODUODENOSCOPY: SHX5428

## 2012-06-03 HISTORY — DX: Personal history of other diseases of the digestive system: Z87.19

## 2012-06-03 SURGERY — EGD (ESOPHAGOGASTRODUODENOSCOPY)
Anesthesia: Moderate Sedation

## 2012-06-03 MED ORDER — MIDAZOLAM HCL 10 MG/2ML IJ SOLN
INTRAMUSCULAR | Status: AC
  Filled 2012-06-03: qty 2

## 2012-06-03 MED ORDER — ESOMEPRAZOLE MAGNESIUM 40 MG PO PACK: 40.0000 mg | PACK | Freq: Two times a day (BID) | ORAL | Status: DC

## 2012-06-03 MED ORDER — SODIUM CHLORIDE 0.9 % IV SOLN: INTRAVENOUS | Status: DC

## 2012-06-03 MED ORDER — FENTANYL CITRATE 0.05 MG/ML IJ SOLN
INTRAMUSCULAR | Status: DC | PRN
  Administered 2012-06-03 (×4): 25 ug via INTRAVENOUS

## 2012-06-03 MED ORDER — MIDAZOLAM HCL 10 MG/2ML IJ SOLN
INTRAMUSCULAR | Status: DC | PRN
  Administered 2012-06-03 (×4): 2 mg via INTRAVENOUS

## 2012-06-03 MED ORDER — FENTANYL CITRATE 0.05 MG/ML IJ SOLN
INTRAMUSCULAR | Status: AC
  Filled 2012-06-03: qty 2

## 2012-06-03 MED ORDER — BUTAMBEN-TETRACAINE-BENZOCAINE 2-2-14 % EX AERO
INHALATION_SPRAY | CUTANEOUS | Status: DC | PRN
  Administered 2012-06-03: 2 via TOPICAL

## 2012-06-03 NOTE — Progress Notes (Signed)
Agree with Ms. Guenther's assessment and plan. Amed Datta E. Khayree Delellis, MD, FACG   

## 2012-06-03 NOTE — Interval H&P Note (Signed)
History and Physical Interval Note:  06/03/2012 8:42 AM  Laura Kelley  has presented today for surgery, with the diagnosis of abdominal pain  The various methods of treatment have been discussed with the patient and family. After consideration of risks, benefits and other options for treatment, the patient has consented to  Procedure(s) (LRB): ESOPHAGOGASTRODUODENOSCOPY (EGD) (N/A) as a surgical intervention .  The patient's history has been reviewed, patient examined, no change in status, stable for surgery.  I have reviewed the patient's chart and labs.  Questions were answered to the patient's satisfaction.     Venita Lick. Russella Dar MD Clementeen Graham

## 2012-06-03 NOTE — Op Note (Signed)
[facility] [address] [place], [address]   ENDOSCOPY PROCEDURE REPORT PATIENT: Laura Kelley, Laura Kelley  MR#: [id number] BIRTHDATE: [birth year] , 46  yrs. old GENDER: Female ENDOSCOPIST: Meryl Dare, MD, [facility] REFERRED BY: PROCEDURE DATE:  06/03/2012 PROCEDURE:  EGD w/ biopsy ASA CLASS:     Class II INDICATIONS:  Upper abdominal pain, chest pain MEDICATIONS: These medications were titrated to patient response per physician's verbal order, Fentanyl 100 mcg IV, and Versed 8 mg IV  TOPICAL ANESTHETIC: Cetacaine Spray DESCRIPTION OF PROCEDURE: After the risks benefits and alternatives of the procedure were thoroughly explained, informed consent was obtained.  The Pentax Gastroscope [id number] endoscope was introduced through the mouth and advanced to the second portion of the duodenum. Without limitations.  The instrument was slowly withdrawn as the mucosa was fully examined.   DUODENUM: A single deep and clean-based ulcer measuring 7 x 5 mm in size was found in the 2nd part of the duodenum.   Moderate duodenal inflammation without erosions was found in the bulb and second portion of the duodenum. STOMACH: Erosive gastritis was found in the gastric antrum.  A biopsy was performed.  Gastritis without ersosions was found in the gastric body.  A biopsy was performed. The stomach otherwise appeared normal. ESOPHAGUS: The mucosa of the esophagus appeared normal.  Retroflexed views revealed no abnormalities.   The scope was then withdrawn from the patient and the procedure completed.  COMPLICATIONS: There were no complications. ENDOSCOPIC IMPRESSION: 1.   Duodenal ulcer 2.   Duodenitis 3.   Erosive gastritis  RECOMMENDATIONS: 1.  Await pathology results 2.  Avoid ASA/NSAIDS 3.  PPI bid: Nexium 40 mg    eSigned:  Meryl Dare, MD, [facility] 06/03/2012 9:29 AM   MV:HQIONGE Oneta Rack, MD

## 2012-06-03 NOTE — H&P (View-Only) (Signed)
Reviewed and agree with management plans.  Kawhi Diebold T. Akyla Vavrek MD FACG  

## 2012-06-04 ENCOUNTER — Encounter (HOSPITAL_COMMUNITY): Payer: Self-pay

## 2012-06-04 ENCOUNTER — Encounter: Payer: Self-pay | Admitting: Gastroenterology

## 2012-06-04 ENCOUNTER — Encounter (HOSPITAL_COMMUNITY): Payer: Self-pay | Admitting: Gastroenterology

## 2012-06-05 ENCOUNTER — Telehealth: Payer: Self-pay | Admitting: Gastroenterology

## 2012-06-05 ENCOUNTER — Other Ambulatory Visit: Payer: Self-pay | Admitting: *Deleted

## 2012-06-05 MED ORDER — BIS SUBCIT-METRONID-TETRACYC 140-125-125 MG PO CAPS: 3.0000 | ORAL_CAPSULE | Freq: Three times a day (TID) | ORAL | Status: DC

## 2012-06-05 NOTE — Telephone Encounter (Signed)
Please change her to omeprazole 40 mg po bid so she won't need to refill her Nexium Please call the drug rep to get samples for 10 days of Pylera for her

## 2012-06-05 NOTE — Telephone Encounter (Signed)
Patient states her Pylera is going to cost her over 500 dollars that she cannot afford right now because she has paid out of pocket for her Nexium which was over 300 dollars. Told patient there is no generic for this medicine and unfortunately we do not have samples of this and cannot get any from the rep. Patient states she really just cannot pay for this medicine right now. Told her I will ask the doctor if we can send in something different like amoxicillin, clarithromycin, and lansoprazole possibly? Please advise.

## 2012-06-05 NOTE — Telephone Encounter (Signed)
Called the rep for Pylera and he will bring by 10 day voucher coarse of Pylera to the office next week. Notified patient that we will call her next week when he brings by the samples. Also told patient that we can switch her Nexium to something cheaper like omeprazole. Pt states she has already paid for Nexium and when her hospital bills actually go through she will have met her deductable for this year and can afford Nexium. Told her to just call us if she runs out before then or if after she has met her deductable and Nexium is still to high of a cost to her. Pt agreed and verbalized understanding.

## 2012-06-05 NOTE — Telephone Encounter (Signed)
Labs have been sent to Dr Oneta Rack, patient's PCP. I have advised patient that she should contact her PCP for follow up of WBC. Patient seems very irritated with this answer and doesn't understand why Dr Russella Dar cant tell her what is going on with her WBC. I explained that Dr Russella Dar is a GI specialist and does not typically follow up on general practice concerns.

## 2012-06-05 NOTE — Telephone Encounter (Signed)
Copy of labs to PCP and she should see her PCP.

## 2012-06-05 NOTE — Telephone Encounter (Signed)
Message copied by Richardson Chiquito on Fri Jun 05, 2012  8:27 AM ------      Message from: Jessee Avers      Created: Fri Jun 05, 2012  8:23 AM                   ----- Message -----         From: Meryl Dare, MD,FACG         Sent: 06/04/2012   4:34 PM           To: Jessee Avers, CMA            EGD with biopsies showed H. pylori so please start Pylera 3 po qid for 10 days.       She is supposed to be on Nexium 40 mg po bid for her recently diagnosed duodenal ulcer.

## 2012-06-05 NOTE — Telephone Encounter (Signed)
I have spoken to patient to advise of positive H Pylori. Advised that she should be on Pylera 3 capsules four times daily x 10 days (I did confirm that she is not allergic to any antibiotics) as well as Nexium 40 mg twice daily (on an ongoing basis). Rx has been sent to her pharmacy. Patient also requested results of labs completed recently. I have advised her that her labs were normal with the exception of mildly elevated WBC. Her WBC was 8.7 on 05/06/12 so she is a bit concerned about this and wanted to make sure that she doesn't need to follow up further on the WBC count. I told her that WBC elevation at times can indicated bacterial infection. She states that she has "felt awful". Should she follow up with her PCP regarding this?

## 2012-06-16 ENCOUNTER — Encounter: Payer: Self-pay | Admitting: Gastroenterology

## 2012-06-16 ENCOUNTER — Ambulatory Visit (AMBULATORY_SURGERY_CENTER): Payer: 59 | Admitting: Gastroenterology

## 2012-06-16 ENCOUNTER — Encounter: Payer: 59 | Admitting: Gastroenterology

## 2012-06-16 VITALS — BP 140/75 | HR 63 | Temp 98.0°F | Resp 17 | Ht 60.0 in | Wt 158.0 lb

## 2012-06-16 DIAGNOSIS — D126 Benign neoplasm of colon, unspecified: Secondary | ICD-10-CM

## 2012-06-16 DIAGNOSIS — R109 Unspecified abdominal pain: Secondary | ICD-10-CM

## 2012-06-16 DIAGNOSIS — R141 Gas pain: Secondary | ICD-10-CM

## 2012-06-16 DIAGNOSIS — Z83719 Family history of colon polyps, unspecified: Secondary | ICD-10-CM

## 2012-06-16 DIAGNOSIS — Z1211 Encounter for screening for malignant neoplasm of colon: Secondary | ICD-10-CM

## 2012-06-16 DIAGNOSIS — Z8371 Family history of colonic polyps: Secondary | ICD-10-CM

## 2012-06-16 LAB — HM COLONOSCOPY

## 2012-06-16 MED ORDER — SODIUM CHLORIDE 0.9 % IV SOLN: 500.0000 mL | INTRAVENOUS | Status: DC

## 2012-06-16 NOTE — Progress Notes (Signed)
Attempted IV in right hand. After initial stick pt jerked hand and iv site became puffy where site blew. Had great blood return with stick but did blow above injection sight. Bruising noted at sight. Sight held with sterile gauze and bleeding stopped. Gauze bandage applied and heat pack applied to hand.  After heat applied patient states sight felt fine with no pain. ewm

## 2012-06-16 NOTE — Progress Notes (Signed)
Patient did not have preoperative order for IV antibiotic SSI prophylaxis. (G8918)Patient did not have preoperative order for IV antibiotic SSI prophylaxis. (G8918)Patient did not experience any of the following events: a burn prior to discharge; a fall within the facility; wrong site/side/patient/procedure/implant event; or a hospital transfer or hospital admission upon discharge from the facility. (G8907) 

## 2012-06-16 NOTE — Patient Instructions (Signed)
Hold aspirin & anti infammatory products for 2 weeks  INFORMATION ON POLYPS GIVEN TO YOU TODAY  YOU HAD AN ENDOSCOPIC PROCEDURE TODAY AT THE [facility]: Refer to the procedure report that was given to you for any specific questions about what was found during the examination.  If the procedure report does not answer your questions, please call your gastroenterologist to clarify.  If you requested that your care partner not be given the details of your procedure findings, then the procedure report has been included in a sealed envelope for you to review at your convenience later.  YOU SHOULD EXPECT: Some feelings of bloating in the abdomen. Passage of more gas than usual.  Walking can help get rid of the air that was put into your GI tract during the procedure and reduce the bloating. If you had a lower endoscopy (such as a colonoscopy or flexible sigmoidoscopy) you may notice spotting of blood in your stool or on the toilet paper. If you underwent a bowel prep for your procedure, then you may not have a normal bowel movement for a few days.  DIET: Your first meal following the procedure should be a light meal and then it is ok to progress to your normal diet.  A half-sandwich or bowl of soup is an example of a good first meal.  Heavy or fried foods are harder to digest and may make you feel nauseous or bloated.  Likewise meals heavy in dairy and vegetables can cause extra gas to form and this can also increase the bloating.  Drink plenty of fluids but you should avoid alcoholic beverages for 24 hours.  ACTIVITY: Your care partner should take you home directly after the procedure.  You should plan to take it easy, moving slowly for the rest of the day.  You can resume normal activity the day after the procedure however you should NOT DRIVE or use heavy machinery for 24 hours (because of the sedation medicines used during the test).    SYMPTOMS TO REPORT IMMEDIATELY: A gastroenterologist  can be reached at any hour.  During normal business hours, 8:30 AM to 5:00 PM Monday through Friday, call [phone].  After hours and on weekends, please call the GI answering service at [phone] who will take a message and have the physician on call contact you.   Following lower endoscopy (colonoscopy or flexible sigmoidoscopy):  Excessive amounts of blood in the stool  Significant tenderness or worsening of abdominal pains  Swelling of the abdomen that is new, acute  Fever of 100F or higher    FOLLOW UP: If any biopsies were taken you will be contacted by phone or by letter within the next 1-3 weeks.  Call your gastroenterologist if you have not heard about the biopsies in 3 weeks.  Our staff will call the home number listed on your records the next business day following your procedure to check on you and address any questions or concerns that you may have at that time regarding the information given to you following your procedure. This is a courtesy call and so if there is no answer at the home number and we have not heard from you through the emergency physician on call, we will assume that you have returned to your regular daily activities without incident.  SIGNATURES/CONFIDENTIALITY: You and/or your care partner have signed paperwork which will be entered into your electronic medical record.  These signatures attest to the fact that that the  information above on your After Visit Summary has been reviewed and is understood.  Full responsibility of the confidentiality of this discharge information lies with you and/or your care-partner.

## 2012-06-16 NOTE — Op Note (Signed)
[facility] [address] [place], [address]   COLONOSCOPY PROCEDURE REPORT PATIENT: Laura Kelley, Laura Kelley  MR#: [id number] BIRTHDATE: 1965-12-15 , 46  yrs. old GENDER: Female ENDOSCOPIST: Meryl Dare, MD, [facility] REFERRED AO:[id number] Oneta Rack, M.D. PROCEDURE DATE:  06/16/2012 PROCEDURE:   Colonoscopy with snare polypectomy and with biopsy ASA CLASS:   Class II INDICATIONS: patient's family history of colon polyps and elevated risk screening. MEDICATIONS: MAC sedation, administered by CRNA and propofol (Diprivan) 300mg  IV DESCRIPTION OF PROCEDURE:   After the risks benefits and alternatives of the procedure were thoroughly explained, informed consent was obtained.  A digital rectal exam revealed no abnormalities of the rectum.   The LB [id number]  endoscope was introduced through the anus and advanced to the cecum, which was identified by both the appendix and ileocecal valve. No adverse events experienced.   The quality of the prep was excellent, using MoviPrep  The instrument was then slowly withdrawn as the colon was fully examined.   COLON FINDINGS: A sessile polyp was found in the descending colon. A polypectomy was performed with a cold snare.  The resection was complete and the polyp tissue was completely retrieved.   Two sessile polyps measuring 6-7 mm in size were found in the sigmoid colon.  A polypectomy was performed using snare cautery.  The resection was complete and the polyp tissue was completely retrieved.   Three sessile polyps measuring 4-6 mm in size were found in the sigmoid colon.  A polypectomy was performed with a cold snare.  The resection was complete and the polyp tissue was completely retrieved.   Three sessile polyps measuring 3 mm in size were found in the sigmoid colon.  A polypectomy was performed with cold forceps.  The resection was complete and the polyp tissue was completely retrieved.   The colon was otherwise normal.   There was no diverticulosis, inflamation, polyps or cancers unless previously stated.  Retroflexed views revealed no abnormalities. The time to cecum=1 minutes 55 seconds.  Withdrawal time=14 minutes 49 seconds. The scope was withdrawn and the procedure completed. COMPLICATIONS: There were no complications. ENDOSCOPIC IMPRESSION: 1.   Sessile polyp in the descending colon; polypectomy performed 2.   Two sessile polyps in the sigmoid colon; polypectomy performed  3.   Three sessile polyps in the sigmoid colon; polypectomy performed 4.   Three sessile polypsin the sigmoid colon; polypectomy performed with cold forceps 5.   The colon was otherwise normal RECOMMENDATIONS: 1.  Await pathology results and avoid ASA/NSAIDs for 2 weeks 2.  Repeat Colonoscopy in 3-5 years pending pathology review eSigned:  Meryl Dare, MD, [facility] 06/16/2012 2:07 PM

## 2012-06-17 ENCOUNTER — Telehealth: Payer: Self-pay | Admitting: *Deleted

## 2012-06-17 NOTE — Telephone Encounter (Signed)
  Follow up Call-  Call back number 06/16/2012  Post procedure Call Back phone  # [phone]  Permission to leave phone message Yes     Patient questions:  Do you have a fever, pain , or abdominal swelling? no Pain Score  0 *  Have you tolerated food without any problems? yes  Have you been able to return to your normal activities? yes  Do you have any questions about your discharge instructions: Diet   no Medications  no Follow up visit  no  Do you have questions or concerns about your Care? no  Actions: * If pain score is 4 or above: No action needed, pain <4.

## 2012-06-22 ENCOUNTER — Encounter: Payer: Self-pay | Admitting: Gastroenterology

## 2012-06-22 ENCOUNTER — Telehealth: Payer: Self-pay | Admitting: Internal Medicine

## 2012-06-22 NOTE — Telephone Encounter (Signed)
In addition, if she notes red blood per rectum, weakness, dizziness, vomiting or any other new GI symptoms please notify us.

## 2012-06-22 NOTE — Telephone Encounter (Signed)
Patient reports black stools for the last 3 days.  She is currently taking Pylera.  She is advised that it is a side effect of the Pylera.  She is advised to call back if she has continued symptoms once she has completed the Pylera.

## 2012-06-22 NOTE — Telephone Encounter (Signed)
Patient aware.

## 2013-02-18 ENCOUNTER — Ambulatory Visit
Admission: RE | Admit: 2013-02-18 | Discharge: 2013-02-18 | Disposition: A | Discharge status: Home / Self Care | Payer: 59 | Source: Ambulatory Visit | Attending: Internal Medicine | Admitting: Internal Medicine

## 2013-02-18 ENCOUNTER — Other Ambulatory Visit: Payer: Self-pay | Admitting: Internal Medicine

## 2013-02-18 DIAGNOSIS — R52 Pain, unspecified: Secondary | ICD-10-CM

## 2013-03-16 ENCOUNTER — Other Ambulatory Visit (HOSPITAL_COMMUNITY): Payer: Self-pay | Admitting: Internal Medicine

## 2013-03-16 ENCOUNTER — Ambulatory Visit (HOSPITAL_COMMUNITY)
Admission: RE | Admit: 2013-03-16 | Discharge: 2013-03-16 | Disposition: A | Discharge status: Home / Self Care | Payer: 59 | Source: Ambulatory Visit | Attending: Internal Medicine | Admitting: Internal Medicine

## 2013-03-16 DIAGNOSIS — M79609 Pain in unspecified limb: Secondary | ICD-10-CM | POA: Insufficient documentation

## 2013-03-16 DIAGNOSIS — M7989 Other specified soft tissue disorders: Secondary | ICD-10-CM | POA: Insufficient documentation

## 2013-03-16 DIAGNOSIS — R52 Pain, unspecified: Secondary | ICD-10-CM

## 2013-03-16 DIAGNOSIS — M25579 Pain in unspecified ankle and joints of unspecified foot: Secondary | ICD-10-CM | POA: Insufficient documentation

## 2013-09-18 ENCOUNTER — Encounter: Payer: Self-pay | Admitting: Internal Medicine

## 2013-09-18 DIAGNOSIS — E785 Hyperlipidemia, unspecified: Secondary | ICD-10-CM | POA: Insufficient documentation

## 2013-09-18 DIAGNOSIS — E559 Vitamin D deficiency, unspecified: Secondary | ICD-10-CM | POA: Insufficient documentation

## 2013-09-18 DIAGNOSIS — K219 Gastro-esophageal reflux disease without esophagitis: Secondary | ICD-10-CM | POA: Insufficient documentation

## 2013-09-20 ENCOUNTER — Encounter: Payer: Self-pay | Admitting: Physician Assistant

## 2013-11-18 ENCOUNTER — Other Ambulatory Visit: Payer: Self-pay | Admitting: Obstetrics and Gynecology

## 2014-07-31 ENCOUNTER — Emergency Department (INDEPENDENT_AMBULATORY_CARE_PROVIDER_SITE_OTHER)
Admission: EM | Admit: 2014-07-31 | Discharge: 2014-07-31 | Disposition: A | Discharge status: Home / Self Care | Payer: 59 | Source: Home / Self Care | Attending: Family Medicine | Admitting: Family Medicine

## 2014-07-31 ENCOUNTER — Encounter (HOSPITAL_COMMUNITY): Payer: Self-pay | Admitting: Emergency Medicine

## 2014-07-31 DIAGNOSIS — K121 Other forms of stomatitis: Secondary | ICD-10-CM

## 2014-07-31 DIAGNOSIS — J029 Acute pharyngitis, unspecified: Secondary | ICD-10-CM

## 2014-07-31 LAB — POCT RAPID STREP A: Streptococcus, Group A Screen (Direct): NEGATIVE

## 2014-07-31 MED ORDER — MAGIC MOUTHWASH W/LIDOCAINE: ORAL | Status: DC

## 2014-07-31 NOTE — ED Notes (Signed)
Started with hard palate pain approx 1 wk ago; then noticed a painful "growth".  3 small lesions noted to hard palate.  She says she also has a sore throat with very painful swallowing.  C/O slight head and nasal congestion - has been taking Advil Cold & Sinus and IBU for pain.  Tried warm salt water gargle.  Denies fevers.

## 2014-07-31 NOTE — Discharge Instructions (Signed)
Oral Ulcers Oral ulcers are painful, shallow sores around the lining of the mouth. They can affect the gums, the inside of the lips, and the cheeks. (Sores on the outside of the lips and on the face are different.) They typically first occur in school-aged children and teenagers. Oral ulcers may also be called canker sores or cold sores. CAUSES  Canker sores and cold sores can be caused by many factors including:  Infection.  Injury.  Sun exposure.  Medications.  Emotional stress.  Food allergies.  Vitamin deficiencies.  Toothpastes containing sodium lauryl sulfate. The herpes virus can be the cause of mouth ulcers. The first infection can be severe and cause 10 or more ulcers on the gums, tongue, and lips with fever and difficulty in swallowing. This infection usually occurs between the ages of 1 and 3 years.  SYMPTOMS  The typical sore is about  inch (6 mm) in size and is an oval or round ulcer with red borders. DIAGNOSIS  Your caregiver can diagnose simple oral ulcers by examination. Additional testing is usually not required.  TREATMENT  Treatment is aimed at pain relief. Generally, oral ulcers resolve by themselves within 1 to 2 weeks without medication and are not contagious unless caused by herpes (and other viruses). Antibiotics are not effective with mouth sores. Avoid direct contact with others until the ulcer is completely healed. See your caregiver for follow-up care as recommended. Also:  Offer a soft diet.  Encourage plenty of fluids to prevent dehydration. Popsicles and milk shakes can be helpful.  Avoid acidic and salty foods and drinks such as orange juice.  Infants and young children will often refuse to drink because of pain. Using a teaspoon, cup, or syringe to give small amounts of fluids frequently can help prevent dehydration.  Cold compresses on the face may help reduce pain.  Pain medication can help control soreness.  A solution of diphenhydramine  mixed with a liquid antacid can be useful to decrease the soreness of ulcers. Consult a caregiver for the dosing.  Liquids or ointments with a numbing ingredient may be helpful when used as recommended.  Older children and teenagers can rinse their mouth with a salt-water mixture (1/2 teaspoon of salt in 8 ounces of water) four times a day. This treatment is uncomfortable but may reduce the time the ulcers are present.  There are many over-the-counter throat lozenges and medications available for oral ulcers. Their effectiveness has not been studied.  Consult your medical caregiver prior to using homeopathic treatments for oral ulcers. SEEK MEDICAL CARE IF:   You think your child needs to be seen.  The pain worsens and you cannot control it.  There are 4 or more ulcers.  The lips and gums begin to bleed and crust.  A single mouth ulcer is near a tooth that is causing a toothache or pain.  Your child has a fever, swollen face, or swollen glands.  The ulcers began after starting a medication.  Mouth ulcers keep reoccurring or last more than 2 weeks.  You think your child is not taking adequate fluids. SEEK IMMEDIATE MEDICAL CARE IF:   Your child has a high fever.  Your child is unable to swallow or becomes dehydrated.  Your child looks or acts very ill.  An ulcer caused by a chemical your child accidentally put in their mouth. Document Released: 11/07/2004 Document Revised: 02/14/2014 Document Reviewed: 06/22/2009 Exit[employer] Patient Information 2015 [employer]. This information is not intended to replace advice   given to you by your health care provider. Make sure you discuss any questions you have with your health care provider.  

## 2014-07-31 NOTE — ED Provider Notes (Signed)
CSN: [id number]     Arrival date & time 07/31/14  1208 History   First MD Initiated Contact with Patient 07/31/14 1240     Chief Complaint  Patient presents with  . Mouth Lesions  . Sore Throat   (Consider location/radiation/quality/duration/timing/severity/associated sxs/prior Treatment) HPI    48 year old female presents complaining of painful lesion in her mouth. For a few days she has had a painful spot on the roof of her mouth. She looked and saw that she has a lesion growing in her mouth. She also has some sinus pressure and mild sore throat, and pain with swallowing. She has taken over-the-counter medications with relief of her symptoms. She has no recent travel or sick contacts. No fever, chills, NVD, body aches, cough.  Past Medical History  Diagnosis Date  . Ulcer   . Hyperlipidemia   . GERD (gastroesophageal reflux disease)     occ s/s  . Vitamin D deficiency    Past Surgical History  Procedure Laterality Date  . Lumbar disc surgery      Dr. Durene Cal  . Cesarean section  1985, 1993    x 2  . Esophagogastroduodenoscopy  06/03/2012    Procedure: ESOPHAGOGASTRODUODENOSCOPY (EGD);  Surgeon: Ladene Artist, MD,FACG;  Location: [facility];  Service: Endoscopy;  Laterality: N/A;   Family History  Problem Relation Age of Onset  . Colon cancer Neg Hx   . Rectal cancer Neg Hx   . Stomach cancer Neg Hx   . Colon polyps Mother   . Multiple sclerosis Mother   . Hemochromatosis Brother    History  Substance Use Topics  . Smoking status: Current Every Day Smoker -- 0.50 packs/day for 17 years    Types: Cigarettes  . Smokeless tobacco: Never Used     Comment: Tobacco info given 05/11/12  . Alcohol Use: Yes     Comment: occ. mixed drink on the weekends   OB History   Grav Para Term Preterm Abortions TAB SAB Ect Mult Living                 Review of Systems  HENT: Positive for mouth sores, sore throat and trouble swallowing.   All other systems reviewed and are  negative.   Allergies  Other  Home Medications   Prior to Admission medications   Medication Sig Start Date End Date Taking? Authorizing Provider  Alum & Mag Hydroxide-Simeth (MAGIC MOUTHWASH W/LIDOCAINE) SOLN 1 teaspoon gargle and spit out as needed whenever necessary 07/31/14   Liam Graham, PA-C  bismuth-metronidazole-tetracycline [facility]) [phone] MG per capsule Take 3 capsules by mouth 4 (four) times daily -  before meals and at bedtime. 06/05/12 06/19/12  Ladene Artist, MD  dicyclomine (BENTYL) 10 MG capsule Take 1 tab twice daily. 05/11/12   Willia Craze, NP  esomeprazole (NEXIUM) 40 MG packet Take 40 mg by mouth 2 (two) times daily before lunch and supper. 06/03/12 06/03/13  Ladene Artist, MD   BP 112/73  Pulse 80  Temp(Src) 98.5 F (36.9 C) (Oral)  Resp 16  SpO2 97%  LMP 07/24/2014 Physical Exam  Nursing note and vitals reviewed. Constitutional: She is oriented to person, place, and time. Vital signs are normal. She appears well-developed and well-nourished. No distress.  HENT:  Head: Normocephalic and atraumatic.  Nose: Right sinus exhibits no maxillary sinus tenderness and no frontal sinus tenderness. Left sinus exhibits no maxillary sinus tenderness and no frontal sinus tenderness.  Mouth/Throat: Uvula is midline, oropharynx is  clear and moist and mucous membranes are normal. Oral lesions (3 small irregularly shaped 1-2 mm diameter ulcerations in the midline posterior portion of the hard palate) present.  Eyes: Conjunctivae are normal. Right eye exhibits no discharge. Left eye exhibits no discharge.  Neck: Normal range of motion. Neck supple. No tracheal deviation present. No thyromegaly present.  Cardiovascular: Normal rate, regular rhythm and normal heart sounds.   Pulmonary/Chest: Effort normal and breath sounds normal. No respiratory distress.  Neurological: She is alert and oriented to person, place, and time. She has normal strength. Coordination normal.   Skin: Skin is warm and dry. No rash noted. She is not diaphoretic.  Psychiatric: She has a normal mood and affect. Judgment normal.    ED Course  Procedures (including critical care time) Labs Review Labs Reviewed  POCT RAPID STREP A ([facility] CARE ONLY)    Imaging Review No results found.   MDM   1. Mouth ulcer   2. Pharyngitis    This is most consistent with a burn. She admits that she may have eaten something, but she doesn't remember for sure. Treat symptomatically with sore throat lozenges. Will give a prescription for Magic mouthwash if the sore throat lozenges do not work well enough. Followup as needed. Recommended followup with dentist if this does not resolve for evaluation of the mouth lesion   Meds ordered this encounter  Medications  . Alum & Mag Hydroxide-Simeth (MAGIC MOUTHWASH W/LIDOCAINE) SOLN    Sig: 1 teaspoon gargle and spit out as needed whenever necessary    Dispense:  200 mL    Refill:  0    Order Specific Question:  Supervising Provider    Answer:  Lynne Leader, [place]     Liam Graham, PA-C 07/31/14 1329

## 2014-08-01 NOTE — ED Provider Notes (Signed)
Medical screening examination/treatment/procedure(s) were performed by a resident physician or non-physician practitioner and as the supervising physician I was immediately available for consultation/collaboration.  Lynne Leader, MD     Gregor Hams, MD 08/01/14 [id number]

## 2014-08-02 LAB — CULTURE, GROUP A STREP

## 2014-09-20 ENCOUNTER — Encounter: Payer: Self-pay | Admitting: Physician Assistant

## 2014-10-05 IMAGING — US US EXTREM LOW VENOUS*L*
1 series · 14 of 24 positions shown · non-contrast
Comparison: None.

CLINICAL DATA: Pain and swelling.

LEFT LOWER EXTREMITY VENOUS DUPLEX ULTRASOUND
TECHNIQUE: Gray-scale sonography with graded compression, as well
as color Doppler and duplex ultrasound, were performed to evaluate
the deep venous system of the lower extremity from the level of the
common femoral vein through the popliteal and proximal calf veins.
Spectral Doppler was utilized to evaluate flow at rest and with
distal augmentation maneuvers.

[Series 1: us extrem low venous*left* · 14 of 31 slices shown]
[im 1/31]
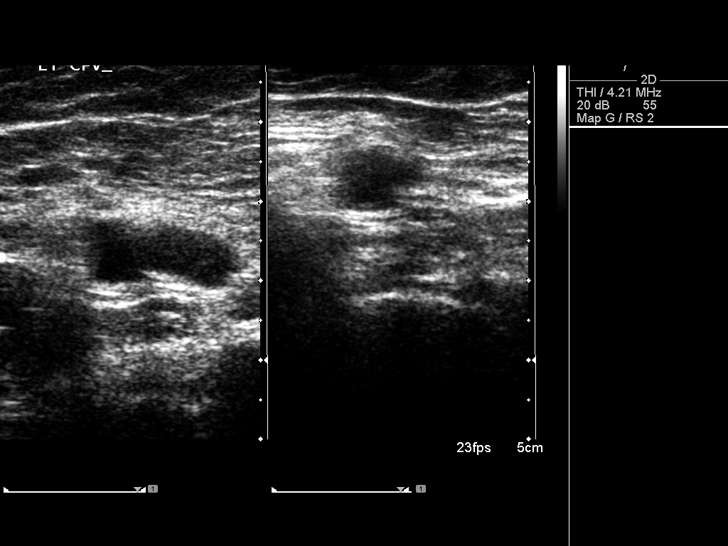
[im 3/31]
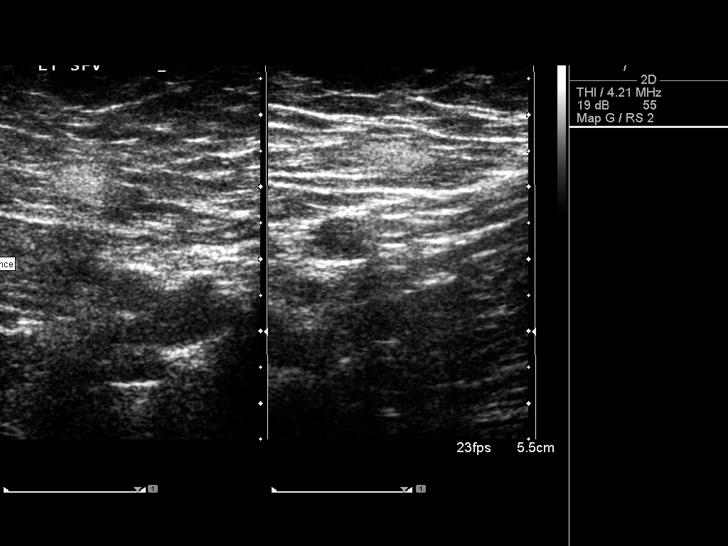
[im 6/31]
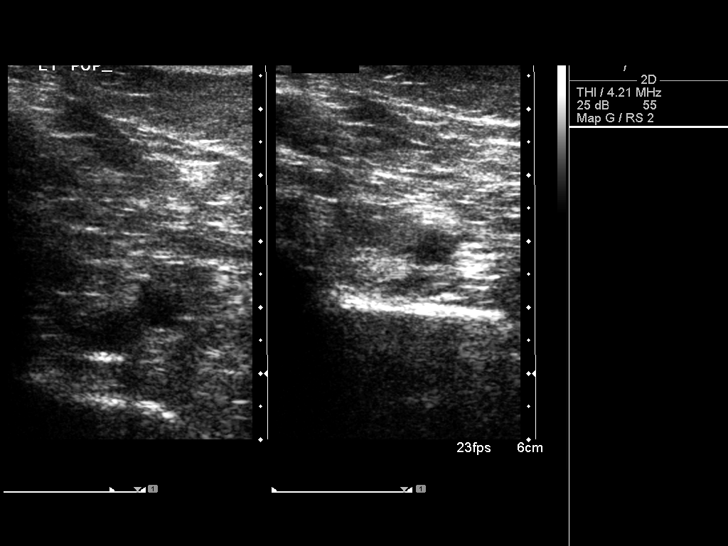
[im 8/31]
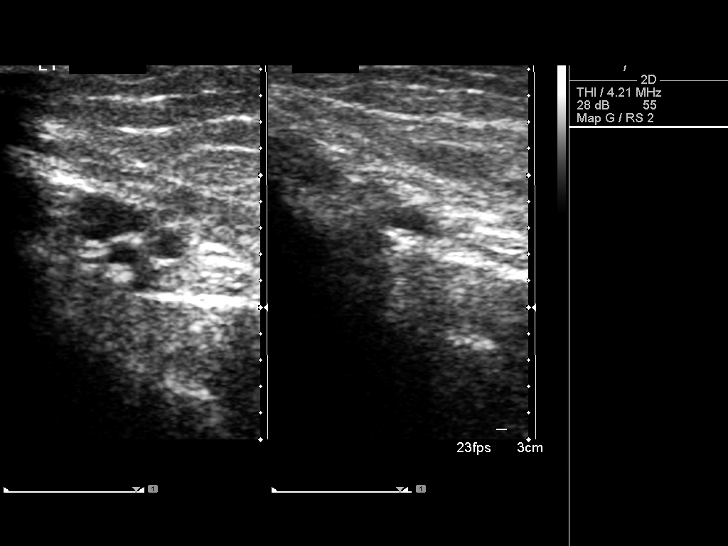
[im 10/31]
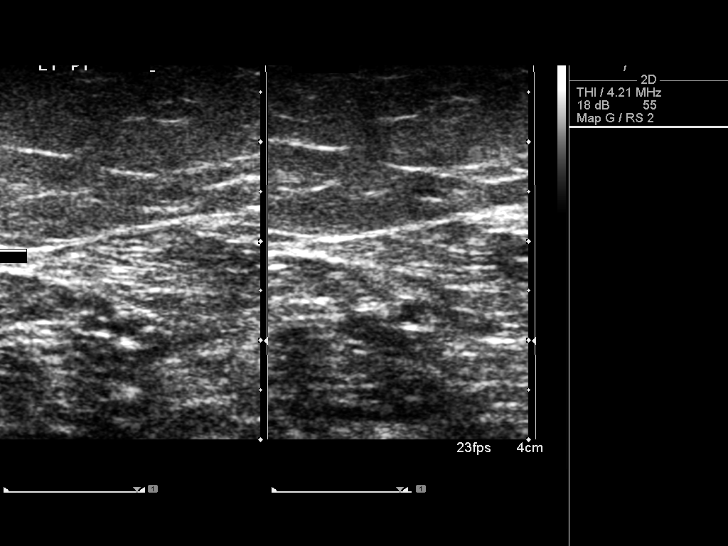
[im 12/31]
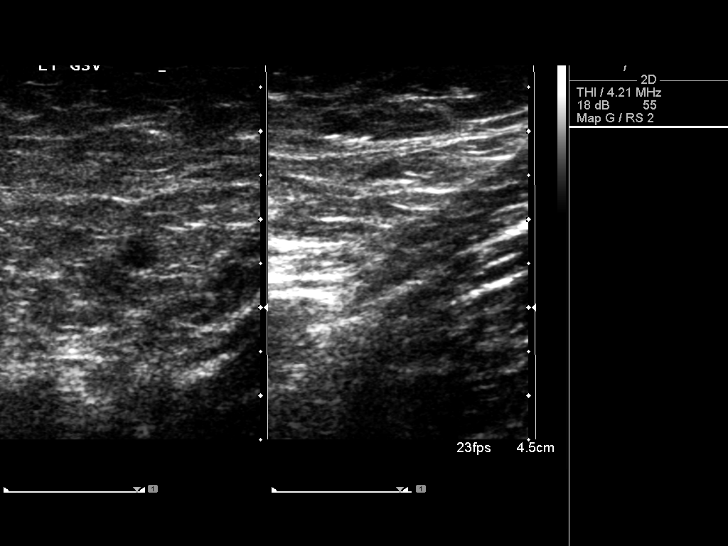
[im 15/31]
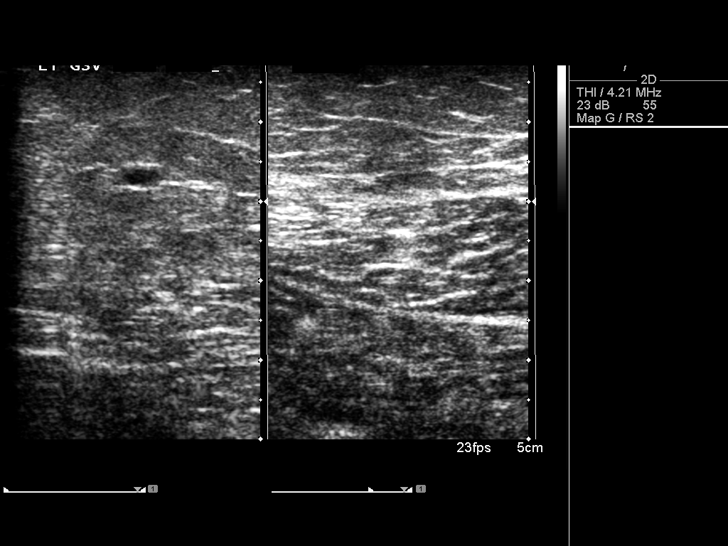
[im 16/31]
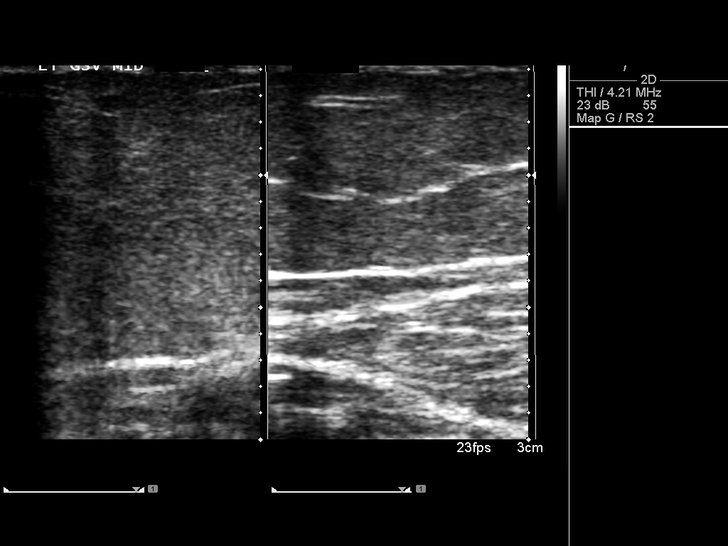
[im 19/31]
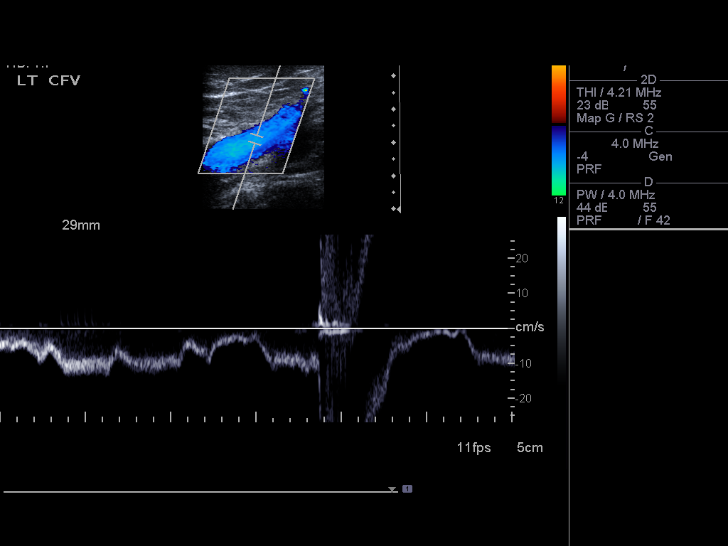
[im 21/31]
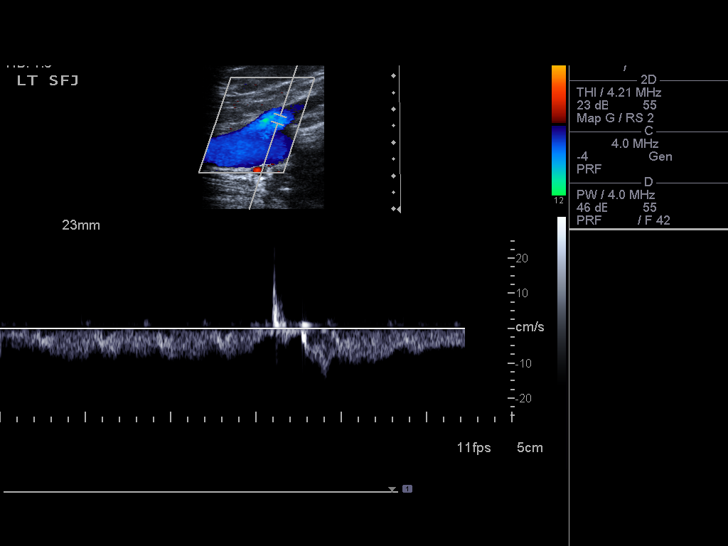
[im 24/31]
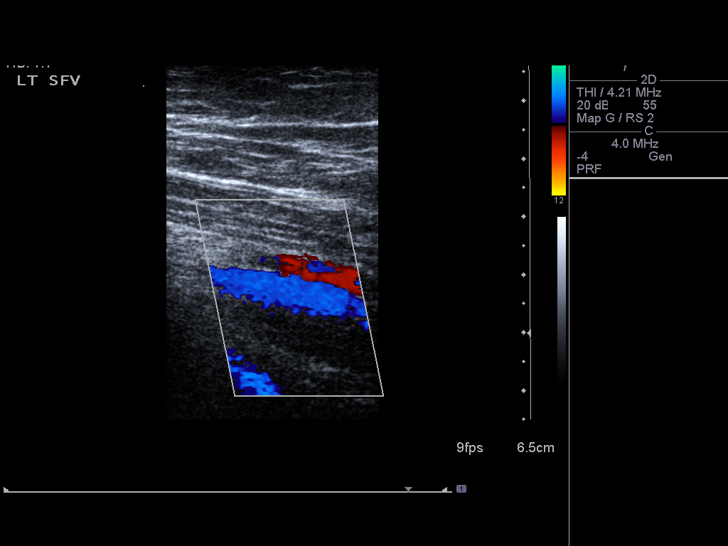
[im 25/31]
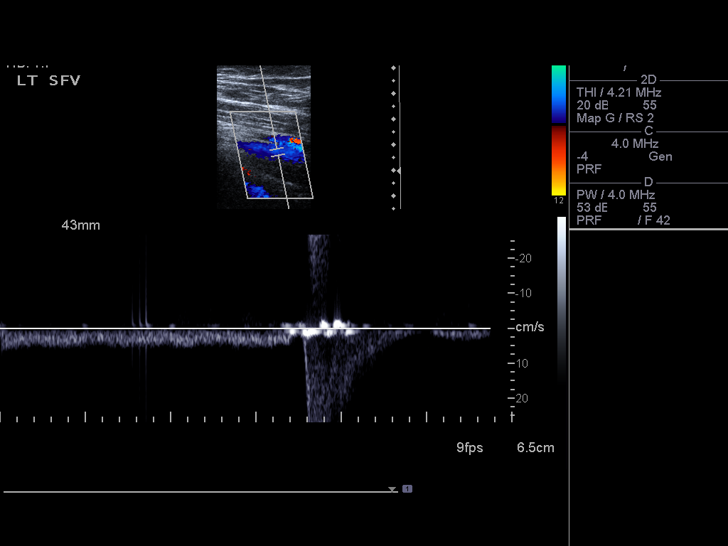
[im 28/31]
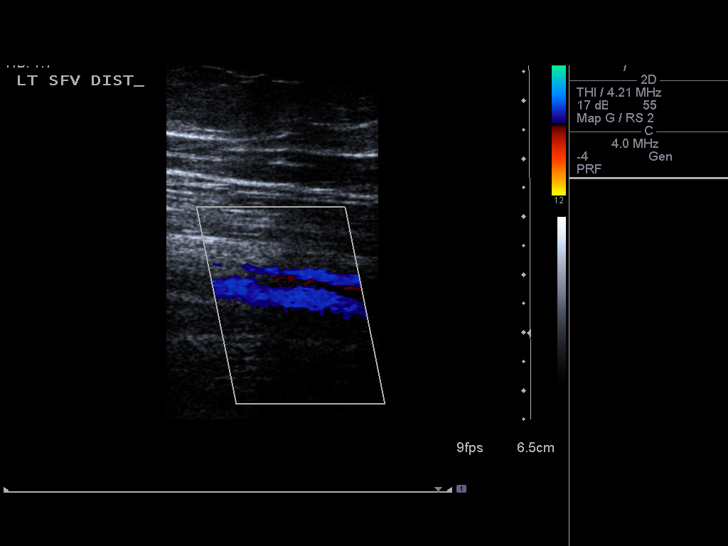
[im 31/31]
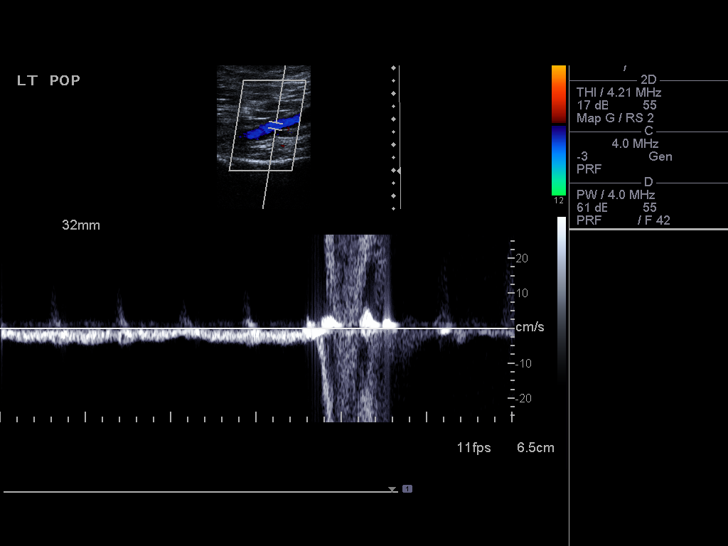

[14 of 24 positions shown; findings below may reference images not displayed]

FINDINGS: There is normal flow, compressibility and augmentation in
the left common femoral, profunda femoral, femoral, popliteal,
posterior tibial, peroneal and anterior tibial veins.  Greater
saphenous vein compresses normally.
IMPRESSION: Negative for deep venous thrombosis in the left lower extremity.

## 2014-11-14 ENCOUNTER — Encounter: Payer: Self-pay | Admitting: Physician Assistant

## 2014-11-14 ENCOUNTER — Ambulatory Visit (INDEPENDENT_AMBULATORY_CARE_PROVIDER_SITE_OTHER): Payer: 59 | Admitting: Physician Assistant

## 2014-11-14 VITALS — BP 120/76 | HR 72 | Temp 98.8°F | Resp 16 | Wt 169.6 lb

## 2014-11-14 DIAGNOSIS — S61259A Open bite of unspecified finger without damage to nail, initial encounter: Secondary | ICD-10-CM

## 2014-11-14 DIAGNOSIS — F419 Anxiety disorder, unspecified: Secondary | ICD-10-CM

## 2014-11-14 DIAGNOSIS — W540XXA Bitten by dog, initial encounter: Principal | ICD-10-CM

## 2014-11-14 DIAGNOSIS — F411 Generalized anxiety disorder: Secondary | ICD-10-CM

## 2014-11-14 MED ORDER — ALPRAZOLAM 1 MG PO TABS: 1.0000 mg | ORAL_TABLET | Freq: Every evening | ORAL | Status: AC | PRN

## 2014-11-14 MED ORDER — SULFAMETHOXAZOLE-TRIMETHOPRIM 800-160 MG PO TABS: 1.0000 | ORAL_TABLET | Freq: Two times a day (BID) | ORAL | Status: DC

## 2014-11-14 NOTE — Progress Notes (Signed)
Assessment and Plan: Dog bite- no need for sutures, good sensation/vascular distal- wrapped in office, will send in bactrim to take IF it starts to appear infected. 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    HPI 49 y.o.female right handed presents for dog bite on right 1st finger last night. She is up to date on tetanus 2012. She was playing with her dog, less than a year old when he accidentally bit her right 1st digit. Dog is up to date on shots, She washed the finger with soap/water ,and peroxide. She denies numbness, tingling.   She is also flying for work and is requesting a xanax renewal, last refill 2 years ago, only takes while flying.   Past Medical History  Diagnosis Date  . Ulcer   . Hyperlipidemia   . GERD (gastroesophageal reflux disease)     occ s/s  . Vitamin D deficiency      Allergies  Allergen Reactions  . Other     Stitches used after growth on back removed, caused redness and itching      Current Outpatient Prescriptions on File Prior to Visit  Medication Sig Dispense Refill  . Alum & Mag Hydroxide-Simeth (MAGIC MOUTHWASH W/LIDOCAINE) SOLN 1 teaspoon gargle and spit out as needed whenever necessary 200 mL 0  . bismuth-metronidazole-tetracycline (PYLERA) 140-125-125 MG per capsule Take 3 capsules by mouth 4 (four) times daily -  before meals and at bedtime. 120 capsule 0  . dicyclomine (BENTYL) 10 MG capsule Take 1 tab twice daily. 60 capsule 1  . esomeprazole (NEXIUM) 40 MG packet Take 40 mg by mouth 2 (two) times daily before lunch and supper. 30 each 3   No current facility-administered medications on file prior to visit.    ROS: all negative except above.   Physical Exam: Filed Weights   11/14/14 1420  Weight: 169 lb 9.6 oz (76.93 kg)   BP 120/76 mmHg  Pulse 72  Temp(Src) 98.8 F (37.1 C) (Temporal)  Resp 16  Wt 169 lb 9.6 oz (76.93 kg) General Appearance: Well nourished, in no apparent distress. Lymphatics: Non  tender without lymphadenopathy.  Musculoskeletal: Full ROM, 5/5 strength, normal gait.  Skin: Warm, dry without rashes, lesions, ecchymosis. Right medial 2nd digit with skin flap, no erythema warmth, + minimal swelling/tenderness, good sensation/cap refill distal, full ROM  Neuro: Cranial nerves intact. Normal muscle tone, no cerebellar symptoms. Sensation intact.      Vicie Mutters, PA-C 2:22 PM [facility]

## 2014-11-14 NOTE — Patient Instructions (Signed)

## 2015-04-18 ENCOUNTER — Encounter: Payer: Self-pay | Admitting: Physician Assistant

## 2015-04-18 ENCOUNTER — Ambulatory Visit (INDEPENDENT_AMBULATORY_CARE_PROVIDER_SITE_OTHER): Payer: 59 | Admitting: Physician Assistant

## 2015-04-18 VITALS — BP 110/72 | HR 76 | Temp 97.7°F | Resp 16 | Wt 172.0 lb

## 2015-04-18 DIAGNOSIS — L918 Other hypertrophic disorders of the skin: Secondary | ICD-10-CM

## 2015-04-18 NOTE — Progress Notes (Signed)
Skin Tag Removal Procedure Note  Pre-operative Diagnosis: Classic skin tags (acrochordon)  Post-operative Diagnosis: Classic skin tags (acrochordon)  Locations:bilateral axilla and neck, 7 total  Indications:  Patient complains of skin tags that are recurrently irritated.   Anesthesia: Lidocaine 1% without epinephrine  Procedure Details  The risks (including bleeding and infection) and benefits of the procedure and Verbal informed consent obtained. Using sterile iris scissors and electrocaudry, multiple skin tags were snipped off at their bases after cleansing with Alcohol.  Bleeding was controlled by pressure.   Procedure Code  11200 Skins tags up to 15  Findings: Pathognomonic benign lesions  not sent for pathological exam.  Condition: Stable  Complications: none.  Plan: 1. Instructed to keep the wounds dry and covered for 24-48h and clean thereafter. 2. Warning signs of infection were reviewed.   3. Recommended that the patient use OTC acetaminophen as needed for pain.  4. Return as needed.

## 2015-04-18 NOTE — Patient Instructions (Signed)
Melanoma Melanoma is a form of skin cancer that begins in melanocytes. Melanocytes are the skin cells that produce pigment. Melanoma starts as a mole on the skin and can spread to other parts of the body. If found early, many cases of melanoma are curable.  CAUSES  The exact cause is unknown.  RISK FACTORS  Spending a lot of time in the sun, under a sunlamp, or in a tanning booth.  Having sunburn that blisters. The more blistering sunburns you have, the higher your risk of melanoma.  Spending time in parts of the world with more intense sunlight.  Living in a hot, sunny climate.  Having fair skin that does not tan easily.  Having had melanoma before.  Having a family history of melanoma.  Having more than 100 skin moles. SIGNS AND SYMPTOMS   ABCDE changes in a mole. ABCDE stands for:   Asymmetry. This means the mole has an irregular shape. It is not round or oval.  Border. This means the mole has an irregular or bumpy border.  Color. This means the mole has multiple colors in it, including brown, black, blue, red, or tan.  Diameter. This means the mole is more than 0.2 in (6 mm) across.  Evolving. This refers to any unusual changes or symptoms in the mole, such as pain, itching, stinging, sensitivity, or bleeding.  A new mole.  Swollen lymph nodes.  Shortness of breath.  Bone pain.  Headache.  Seizures.  Visual problems. DIAGNOSIS  Your health care provider will take a tissue sample from the mole to examine under a microscope (biopsy). The biopsy will reveal whether melanoma has spread to deeper layers of the skin. Your health care provider will also order tests, including:  Blood tests.  Chest X-rays.  A CT scan.  A bone scan. TREATMENT  You will have surgery to remove the cancer. Your lymph nodes may also be removed during the surgery. If melanoma has spread to other organs, such as the liver, lungs, bone, or brain, you will need additional treatment.    PREVENTION  Melanoma may come back (recur) after it has been treated. Your risk of recurrence is higher if you had thick or ulcerated tumors or patches of tumors. Generally, the more advanced your melanoma, the more likely it will recur. You can help prevent melanoma from recurring by staying out of the sun, especially during peak midafternoon hours. When you are outdoors or in the sun:  Wear long sleeves, a hat, and sunglasses that block UV light when possible.  Apply a sunscreen with an SPF of 30 or higher regularly. Take these precautions on cloudy days and in the winter, even if you will be outdoors for only a short period of time. SEEK MEDICAL CARE IF:  You notice any new growths or changes in your skin.  You have had melanoma removed and you notice a new growth in the same location. Document Released: 09/30/2005 Document Revised: 02/14/2014 Document Reviewed: 11/24/2013 [facility] Patient Information 2015 [place], [place]. This information is not intended to replace advice given to you by your health care provider. Make sure you discuss any questions you have with your health care provider.  Squamous Cell Carcinoma  Squamous cell carcinoma is the second most common form of skin cancer. It begins in the squamous cells in the outer layer of the skin (epidermis).  CAUSES  Ultraviolet light exposure is the most common cause of squamous cell carcinoma. This may come from sunlight or tanning beds.  Squamous cell carcinoma is most common in sun-exposed areas like the face, neck, arms, and hands. However, squamous cell carcinoma can occur anywhere on the body, including the lips, inside the mouth, the legs, sites of long-term (chronic) scarring, and the anus.  Other causes of squamous cell carcinoma can include:  Exposure to arsenic.  Exposure to radiation.  Exposure to toxic tars and oils. RISK FACTORS Factors that increase your risk for squamous cell carcinoma include:  Having fair  skin.  Being middle-aged or elderly.  Heavy sun exposure, especially during childhood.  Repeated sunburns.  Use of tanning beds.  A weakened immune system. This includes patients who have received a transplant and patients with human immunodeficiency virus (HIV) or acquired immunodeficiency syndrome (AIDS).  Human papillomavirus infection.  Conditions that cause chronic scarring. This can include burn scars, chronic ulcers, heat (thermal) injuries, and radiation.  Exposure to psoralen plus ultraviolet A light therapy.  Exposure to chemical carcinogens, such as tar, soot, and arsenic.  Chronic inflammatory conditions such as lupus, lichen planus, or lichen sclerosus.  Chronic infections, such as infections of the bone (osteomyelitis).  Smoking. SYMPTOMS  Squamous cell carcinoma often starts as small, skin-colored (pink or brown), sandpaper-like growths. These growths are called solar keratoses or actinic keratoses. These growths are often more easily felt than seen.  DIAGNOSIS  Your caregiver may be able to tell what is wrong by doing a physical exam. Often, a tissue sample is also taken. The tissue sample is examined under a microscope.  TREATMENT  The treatment for squamous cell carcinoma depends on the size and location of the tumors, as well as your overall health. Possible treatments include:   Mohs surgery. This is a procedure done by a skin doctor (dermatologist or Mohs surgeon) in his or her office. The cancerous cells are removed layer by layer.  Laser surgery to remove the tumor.  Freezing the tumor with liquid nitrogen (cryosurgery).  Radiation. This may be used for tumors on the face.  Electrodesiccation and curettage. This involves alternately scraping and burning the tumor, using an electric current to control bleeding. If treated soon enough, squamous cell carcinoma rarely spreads to other areas of the body (metastasizes). If left untreated, however, squamous  cell carcinoma will destroy the nearby tissues. This can result in the loss of a nose or ear. PREVENTION  Avoid the sun between 10:00 a.m. and 4:00 p.m. when it is the strongest.  Use a sunscreen or sunblock with sun protection factor 30 or greater.  Apply sunscreen at least 30 minutes before exposure to the sun.  Reapply sunscreen every 2 to 4 hours while you are outside, after swimming, and after excessive sweating.  Always wear protective hats, clothing, and sunglasses with ultraviolet protection.  Avoid tanning beds. HOME CARE INSTRUCTIONS   Avoid unprotected sun exposure.  Do not smoke.  Follow your caregiver's instructions for self-exams. Look for new growths or changes in your skin.  Keep all follow-up appointments as directed by your caregiver. SEEK MEDICAL CARE IF:   You notice any new growths or changes in your skin.  You have had a squamous cell carcinoma tumor removed and you notice a new growth in the same location. Document Released: 04/06/2003 Document Revised: 02/14/2014 Document Reviewed: 06/24/2011 [facility] Patient Information 2015 [place], [place]. This information is not intended to replace advice given to you by your health care provider. Make sure you discuss any questions you have with your health care provider.  Basal Cell Carcinoma Basal  cell carcinoma is the most common form of skin cancer. It begins in the basal cells, which are at the bottom of the outer skin layer (epidermis). CAUSES  Sun exposure is the most common cause of basal cell carcinoma. Basal cell carcinoma occurs most often on parts of the body that are frequently exposed to the sun, including the:  Scalp.  Ears.  Neck.  Face.  Arms.  Backs of the hands.  Legs. However, basal cell carcinoma can occur anywhere on the body. Rarely, tumors develop on areas not exposed to the sun. Other causes of basal cell carcinoma can include:  Exposure to arsenic.  Exposure to  radiation.  Certain genetic syndromes, such as xeroderma pigmentosum. RISK FACTORS People at highest risk for basal cell carcinoma include those with:  Fair skin.  Blonde or red hair.  Blue, green, or gray eyes.  Childhood freckling. Factors that increase your risk for basal cell carcinoma include:  Sun exposure over long periods of time. Childhood sun exposure appears to be a more significant factor than sun exposure as an adult.  Repeated sunburns.  Use of tanning beds.  Having a weakened immune system. SYMPTOMS Five signs of basal cell carcinoma are:  An open sore that bleeds, oozes, or crusts. The sore may remain open for 3 or more weeks. This can be an early sign of basal cell carcinoma. Basal cell carcinoma can mimic a pimple that will not heal.  A reddish or irritated area which may crust, itch, or cause discomfort. This may occur on areas expose d to the sun. These patches might be easier felt than seen.  A shiny, pearly, or translucent bump that is pink, red, or white. The bump may also be tan, black, or brown, especially in dark haired people. These bumps can be confused with moles.  A pink growth with a slightly elevated, rolled border, and a crusted indentation in the center. As the growth slowly enlarges, tiny blood vessels may develop on the surface.  A scar-like white, yellow, or waxy area that looks like shiny, stretched skin. It often has irregular borders. This may be a sign of more aggressive basal cell carcinoma. DIAGNOSIS  Your caregiver may be able to tell what is wrong by doing a physical exam. Often, a tissue sample (biopsy) is also taken. The tissue is examined under a microscope.  TREATMENT  The treatment for basal cell carcinoma depends on the type, size, location, and number of tumors. Possible treatments include:   Mohs surgery. This is a procedure done by a skin doctor (dermatologist or Mohs surgeon) in his or her office. The cancerous cells are  removed layer by layer. This treatment has a high cure rate.  Surgical removal of the tumor.  Freezing the tumor with liquid nitrogen (cryosurgery).  Plastic surgery to remove the tumor, in the case of large tumors.  Radiation. This may be used for tumors on the face.  Photodynamic therapy. A chemical cream is applied to the skin and light exposure is used to activate the chemical.  Chemical treatments, such as imiquimod cream and interferon injections. This may be used to remove superficial tumors with minimal scarring.  Electrodesiccation and curettage. This involves alternately scraping and burning the tumor, using an electric current to control bleeding. Basal cell carcinoma can almost always be cured. It rarely spreads to other areas of the body (metastasizes). Basal cell carcinoma may come back at the same location (recur), but it can be treated again if this  occurs. PREVENTION  Avoid the sun between 10:00 a.m. and 4:00 pm when it is the strongest.  Use a sunscreen or sunblock with a sun protection factor of 30 or greater.  Apply sunscreen at least 30 minutes before exposure to the sun.  Reapply sunscreen every 2 to 4 hours while you are outside, after swimming, and after excessive sweating.  Always wear protective hats, clothing, and sunglasses with ultraviolet protection.  Avoid tanning beds. HOME CARE INSTRUCTIONS   Avoid unprotected sun exposure.  Follow your caregiver's instructions for self-exams. Look for new spots or changes in your skin.  Keep all follow-up appointments as directed by your caregiver. SEEK MEDICAL CARE IF:   You notice any new spots or changes in your skin.  You have had a basal cell carcinoma tumor removed and you notice a new growth in the same location. Document Released: 04/06/2003 Document Revised: 03/31/2012 Document Reviewed: 06/24/2011 [facility] Patient Information 2015 [place], [place]. This information is not intended to replace advice  given to you by your health care provider. Make sure you discuss any questions you have with your health care provider.

## 2015-08-31 ENCOUNTER — Encounter: Payer: Self-pay | Admitting: Physician Assistant

## 2015-08-31 ENCOUNTER — Ambulatory Visit (HOSPITAL_COMMUNITY)
Admission: RE | Admit: 2015-08-31 | Discharge: 2015-08-31 | Disposition: A | Discharge status: Home / Self Care | Payer: 59 | Source: Ambulatory Visit | Attending: Physician Assistant | Admitting: Physician Assistant

## 2015-08-31 ENCOUNTER — Ambulatory Visit (INDEPENDENT_AMBULATORY_CARE_PROVIDER_SITE_OTHER): Payer: 59 | Admitting: Physician Assistant

## 2015-08-31 VITALS — BP 126/70 | HR 89 | Temp 97.9°F | Resp 14 | Ht 60.5 in | Wt 174.0 lb

## 2015-08-31 DIAGNOSIS — Z83719 Family history of colon polyps, unspecified: Secondary | ICD-10-CM

## 2015-08-31 DIAGNOSIS — R9389 Abnormal findings on diagnostic imaging of other specified body structures: Secondary | ICD-10-CM

## 2015-08-31 DIAGNOSIS — R6889 Other general symptoms and signs: Secondary | ICD-10-CM | POA: Diagnosis not present

## 2015-08-31 DIAGNOSIS — R197 Diarrhea, unspecified: Secondary | ICD-10-CM

## 2015-08-31 DIAGNOSIS — Z8371 Family history of colonic polyps: Secondary | ICD-10-CM

## 2015-08-31 DIAGNOSIS — K2100 Gastro-esophageal reflux disease with esophagitis, without bleeding: Secondary | ICD-10-CM

## 2015-08-31 DIAGNOSIS — K269 Duodenal ulcer, unspecified as acute or chronic, without hemorrhage or perforation: Secondary | ICD-10-CM

## 2015-08-31 DIAGNOSIS — R1013 Epigastric pain: Secondary | ICD-10-CM

## 2015-08-31 DIAGNOSIS — Z79899 Other long term (current) drug therapy: Secondary | ICD-10-CM

## 2015-08-31 DIAGNOSIS — F17200 Nicotine dependence, unspecified, uncomplicated: Secondary | ICD-10-CM

## 2015-08-31 DIAGNOSIS — Z0000 Encounter for general adult medical examination without abnormal findings: Secondary | ICD-10-CM

## 2015-08-31 DIAGNOSIS — E669 Obesity, unspecified: Secondary | ICD-10-CM | POA: Insufficient documentation

## 2015-08-31 DIAGNOSIS — Z0001 Encounter for general adult medical examination with abnormal findings: Secondary | ICD-10-CM | POA: Diagnosis present

## 2015-08-31 DIAGNOSIS — E559 Vitamin D deficiency, unspecified: Secondary | ICD-10-CM

## 2015-08-31 DIAGNOSIS — Z720 Tobacco use: Secondary | ICD-10-CM | POA: Diagnosis not present

## 2015-08-31 DIAGNOSIS — E785 Hyperlipidemia, unspecified: Secondary | ICD-10-CM

## 2015-08-31 DIAGNOSIS — R7309 Other abnormal glucose: Secondary | ICD-10-CM

## 2015-08-31 DIAGNOSIS — D649 Anemia, unspecified: Secondary | ICD-10-CM

## 2015-08-31 DIAGNOSIS — Z1159 Encounter for screening for other viral diseases: Secondary | ICD-10-CM

## 2015-08-31 LAB — CBC WITH DIFFERENTIAL/PLATELET
Basophils Absolute: 0 10*3/uL (ref 0.0–0.1)
Basophils Relative: 0 % (ref 0–1)
Eosinophils Absolute: 0.4 10*3/uL (ref 0.0–0.7)
Eosinophils Relative: 4 % (ref 0–5)
HCT: 43 % (ref 36.0–46.0)
Hemoglobin: 14.5 g/dL (ref 12.0–15.0)
Lymphocytes Relative: 23 % (ref 12–46)
Lymphs Abs: 2.2 10*3/uL (ref 0.7–4.0)
MCH: 31.4 pg (ref 26.0–34.0)
MCHC: 33.7 g/dL (ref 30.0–36.0)
MCV: 93.1 fL (ref 78.0–100.0)
MPV: 9.7 fL (ref 8.6–12.4)
Monocytes Absolute: 0.9 10*3/uL (ref 0.1–1.0)
Monocytes Relative: 9 % (ref 3–12)
Neutro Abs: 6.1 10*3/uL (ref 1.7–7.7)
Neutrophils Relative %: 64 % (ref 43–77)
Platelets: 351 10*3/uL (ref 150–400)
RBC: 4.62 MIL/uL (ref 3.87–5.11)
RDW: 13.7 % (ref 11.5–15.5)
WBC: 9.5 10*3/uL (ref 4.0–10.5)
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
## 2015-08-31 NOTE — Patient Instructions (Addendum)
Dr. Fuller Plan Phone: [phone];   Benefiber is good for constipation/diarrhea/irritable bowel syndrome, it helps with weight loss and can help lower your bad cholesterol. Please do 1-2 TBSP in the morning in water, coffee, or tea. It can take up to a month before you can see a difference with your bowel movements. It is cheapest from [employer], [employer], [employer].   GETTING OFF OF PPI's    Nexium/protonix/prilosec/Omeprazole/Dexilant/Aciphex are called PPI's, they are great at healing your stomach but should only be taken for a short period of time.     Recent studies have shown that taken for a long time they  can increase the risk of osteoporosis (weakening of your bones), pneumonia, low magnesium, restless legs, Cdiff (infection that causes diarrhea), DEMENTIA and most recently kidney damage / disease / insufficiency.     Due to this information we want to try to stop the PPI but if you try to stop it abruptly this can cause rebound acid and worsening symptoms.   So this is how we want you to get off the PPI:  - Start taking the nexium/protonix/prilosec/PPI  every other day with  zantac (ranitidine) 2 x a day for 2-4 weeks  - then decrease the PPI to every 3 days while taking the zantac (ranitidine) twice a day the other  days for 2-4  Weeks  - then you can try the zantac (ranitidine) once at night or up to 2 x day as needed.  - you can continue on this once at night or stop all together  - Avoid alcohol, spicy foods, NSAIDS (aleve, ibuprofen) at this time. See foods below.   +++++++++++++++++++++++++++++++++++++++++++  Food Choices for Gastroesophageal Reflux Disease  When you have gastroesophageal reflux disease (GERD), the foods you eat and your eating habits are very important. Choosing the right foods can help ease the discomfort of GERD. WHAT GENERAL GUIDELINES DO I NEED TO FOLLOW?  Choose fruits, vegetables, whole grains, low-fat dairy products, and low-fat meat, fish, and  poultry.  Limit fats such as oils, salad dressings, butter, nuts, and avocado.  Keep a food diary to identify foods that cause symptoms.  Avoid foods that cause reflux. These may be different for different people.  Eat frequent small meals instead of three large meals each day.  Eat your meals slowly, in a relaxed setting.  Limit fried foods.  Cook foods using methods other than frying.  Avoid drinking alcohol.  Avoid drinking large amounts of liquids with your meals.  Avoid bending over or lying down until 2-3 hours after eating.   WHAT FOODS ARE NOT RECOMMENDED? The following are some foods and drinks that may worsen your symptoms:  Vegetables Tomatoes. Tomato juice. Tomato and spaghetti sauce. Chili peppers. Onion and garlic. Horseradish. Fruits Oranges, grapefruit, and lemon (fruit and juice). Meats High-fat meats, fish, and poultry. This includes hot dogs, ribs, ham, sausage, salami, and bacon. Dairy Whole milk and chocolate milk. Sour cream. Cream. Butter. Ice cream. Cream cheese.  Beverages Coffee and tea, with or without caffeine. Carbonated beverages or energy drinks. Condiments Hot sauce. Barbecue sauce.  Sweets/Desserts Chocolate and cocoa. Donuts. Peppermint and spearmint. Fats and Oils High-fat foods, including [place] fries and potato chips. Other Vinegar. Strong spices, such as black pepper, white pepper, red pepper, cayenne, curry powder, cloves, ginger, and chili powder. Nexium/protonix/prilosec are called PPI's, they are great at healing your stomach but should only be taken for a short period of time.        Bad  carbs also include fruit juice, alcohol, and sweet tea. These are empty calories that do not signal to your brain that you are full.   Please remember the good carbs are still carbs which convert into sugar. So please measure them out no more than 1/2-1 cup of rice, oatmeal, pasta, and beans  Veggies are however free foods! Pile them  on.   Not all fruit is created equal. Please see the list below, the fruit at the bottom is higher in sugars than the fruit at the top. Please avoid all dried fruits.

## 2015-09-01 ENCOUNTER — Telehealth: Payer: Self-pay | Admitting: Internal Medicine

## 2015-09-01 LAB — IRON AND TIBC
%SAT: 37 % (ref 11–50)
Iron: 118 ug/dL (ref 45–160)
TIBC: 322 ug/dL (ref 250–450)
UIBC: 204 ug/dL (ref 125–400)

## 2015-09-01 LAB — MICROALBUMIN / CREATININE URINE RATIO
Creatinine, Urine: 51 mg/dL (ref 20–320)
Microalb, Ur: <0.2 mg/dL

## 2015-09-01 LAB — FERRITIN: Ferritin: 29 ng/mL (ref 10–291)

## 2015-09-01 LAB — VITAMIN B12: Vitamin B-12: 780 pg/mL (ref 211–911)

## 2015-09-01 LAB — HIV ANTIBODY (ROUTINE TESTING W REFLEX): HIV 1&2 Ab, 4th Generation: NONREACTIVE

## 2015-09-01 LAB — URINALYSIS, ROUTINE W REFLEX MICROSCOPIC
Bilirubin Urine: NEGATIVE
Glucose, UA: NEGATIVE
Hgb urine dipstick: NEGATIVE
Ketones, ur: NEGATIVE
Leukocytes, UA: NEGATIVE
Nitrite: NEGATIVE
Protein, ur: NEGATIVE
Specific Gravity, Urine: 1.012 (ref 1.001–1.035)
pH: 5 (ref 5.0–8.0)

## 2015-09-01 LAB — HEPATIC FUNCTION PANEL
ALT: 10 U/L (ref 6–29)
AST: 13 U/L (ref 10–35)
Albumin: 4.2 g/dL (ref 3.6–5.1)
Alkaline Phosphatase: 65 U/L (ref 33–130)
Bilirubin, Direct: 0.1 mg/dL (ref <=0.2)
Indirect Bilirubin: 0.2 mg/dL (ref 0.2–1.2)
Total Bilirubin: 0.3 mg/dL (ref 0.2–1.2)
Total Protein: 6.9 g/dL (ref 6.1–8.1)

## 2015-09-01 LAB — INSULIN, FASTING: Insulin fasting, serum: 9 u[IU]/mL (ref 2.0–19.6)

## 2015-09-01 LAB — BASIC METABOLIC PANEL WITH GFR
BUN: 14 mg/dL (ref 7–25)
CO2: 33 mmol/L — ABNORMAL HIGH (ref 20–31)
Calcium: 9.7 mg/dL (ref 8.6–10.4)
Chloride: 103 mmol/L (ref 98–110)
Creat: 0.67 mg/dL (ref 0.50–1.05)
GFR, Est African American: >89 mL/min (ref >=60)
GFR, Est Non African American: >89 mL/min (ref >=60)
Glucose, Bld: 86 mg/dL (ref 65–99)
Potassium: 4.8 mmol/L (ref 3.5–5.3)
Sodium: 139 mmol/L (ref 135–146)

## 2015-09-01 LAB — LIPID PANEL
Cholesterol: 184 mg/dL (ref 125–200)
HDL: 48 mg/dL (ref >=46)
LDL Cholesterol: 119 mg/dL (ref <130)
Total CHOL/HDL Ratio: 3.8 Ratio (ref <=5.0)
Triglycerides: 87 mg/dL (ref <150)
VLDL: 17 mg/dL (ref <30)

## 2015-09-01 LAB — HEMOGLOBIN A1C
Hgb A1c MFr Bld: 5.9 % — ABNORMAL HIGH (ref <5.7)
Mean Plasma Glucose: 123 mg/dL — ABNORMAL HIGH (ref <117)

## 2015-09-01 LAB — HEPATITIS C ANTIBODY: HCV Ab: NEGATIVE

## 2015-09-01 LAB — TSH: TSH: 1.43 u[IU]/mL (ref 0.350–4.500)

## 2015-09-01 LAB — VITAMIN D 25 HYDROXY (VIT D DEFICIENCY, FRACTURES): Vit D, 25-Hydroxy: 25 ng/mL — ABNORMAL LOW (ref 30–100)

## 2015-09-01 LAB — MAGNESIUM: Magnesium: 2.1 mg/dL (ref 1.5–2.5)

## 2015-09-01 NOTE — Addendum Note (Signed)
Addended by: Vicie Mutters R on: 09/01/2015 11:36 AM   Modules accepted: Orders, SmartSet

## 2015-09-01 NOTE — Telephone Encounter (Signed)
requesting returned call on Aorta scan results.

## 2015-09-12 ENCOUNTER — Ambulatory Visit
Admission: RE | Admit: 2015-09-12 | Discharge: 2015-09-12 | Disposition: A | Discharge status: Home / Self Care | Payer: 59 | Source: Ambulatory Visit | Attending: Physician Assistant | Admitting: Physician Assistant

## 2015-09-12 DIAGNOSIS — F17200 Nicotine dependence, unspecified, uncomplicated: Secondary | ICD-10-CM

## 2015-09-12 DIAGNOSIS — R9389 Abnormal findings on diagnostic imaging of other specified body structures: Secondary | ICD-10-CM

## 2015-09-12 DIAGNOSIS — R1013 Epigastric pain: Secondary | ICD-10-CM

## 2016-01-28 ENCOUNTER — Emergency Department (HOSPITAL_BASED_OUTPATIENT_CLINIC_OR_DEPARTMENT_OTHER)
Admission: EM | Admit: 2016-01-28 | Discharge: 2016-01-29 | Disposition: A | Discharge status: Home / Self Care | Payer: 59 | Attending: Emergency Medicine | Admitting: Emergency Medicine

## 2016-01-28 ENCOUNTER — Encounter (HOSPITAL_BASED_OUTPATIENT_CLINIC_OR_DEPARTMENT_OTHER): Payer: Self-pay | Admitting: Emergency Medicine

## 2016-01-28 DIAGNOSIS — T189XXA Foreign body of alimentary tract, part unspecified, initial encounter: Secondary | ICD-10-CM | POA: Insufficient documentation

## 2016-01-28 DIAGNOSIS — F17210 Nicotine dependence, cigarettes, uncomplicated: Secondary | ICD-10-CM | POA: Diagnosis not present

## 2016-01-28 DIAGNOSIS — E785 Hyperlipidemia, unspecified: Secondary | ICD-10-CM | POA: Insufficient documentation

## 2016-01-28 DIAGNOSIS — Y939 Activity, unspecified: Secondary | ICD-10-CM | POA: Insufficient documentation

## 2016-01-28 DIAGNOSIS — Y929 Unspecified place or not applicable: Secondary | ICD-10-CM | POA: Diagnosis not present

## 2016-01-28 DIAGNOSIS — Y999 Unspecified external cause status: Secondary | ICD-10-CM | POA: Insufficient documentation

## 2016-01-28 DIAGNOSIS — T180XXA Foreign body in mouth, initial encounter: Secondary | ICD-10-CM

## 2016-01-28 DIAGNOSIS — J029 Acute pharyngitis, unspecified: Secondary | ICD-10-CM | POA: Diagnosis not present

## 2016-01-28 DIAGNOSIS — X58XXXA Exposure to other specified factors, initial encounter: Secondary | ICD-10-CM | POA: Diagnosis not present

## 2016-01-28 DIAGNOSIS — T18108A Unspecified foreign body in esophagus causing other injury, initial encounter: Secondary | ICD-10-CM

## 2016-01-28 DIAGNOSIS — T182XXA Foreign body in stomach, initial encounter: Secondary | ICD-10-CM

## 2016-01-28 MED ORDER — GI COCKTAIL ~~LOC~~
30.0000 mL | Freq: Once | ORAL | Status: AC
  Administered 2016-01-28: 30 mL via ORAL
  Filled 2016-01-28: qty 30

## 2016-01-28 NOTE — ED Notes (Signed)
Pt in c/o sharp object stuck in throat, believes it is a bone. Airway intact and no respiratory difficulty.

## 2016-01-28 NOTE — ED Provider Notes (Signed)
CSN: [id number]     Arrival date & time 01/28/16  2259 History   First MD Initiated Contact with Patient 01/28/16 2339     Chief Complaint  Patient presents with  . Swallowed Foreign Body    HPI   NICLOE WATT is a 50 y.o. female with a PMH of HLD, GERD who presents to the ED after swallowing a bone while eating steak tonight prior to arrival. She reports she tried to drink fluids in an attempt to make the object pass, however was unable to do so, prompting her to come to the ED. She states she coughed the substance up during triage, and now notes significant symptom improvement, though states her throat still feels sore. She denies shortness of breath, nausea, vomiting, history of similar symptoms.   Past Medical History  Diagnosis Date  . Ulcer   . Hyperlipidemia   . GERD (gastroesophageal reflux disease)     occ s/s  . Vitamin D deficiency    Past Surgical History  Procedure Laterality Date  . Lumbar disc surgery      Dr. Durene Cal  . Cesarean section  1985, 1993    x 2  . Esophagogastroduodenoscopy  06/03/2012    Procedure: ESOPHAGOGASTRODUODENOSCOPY (EGD);  Surgeon: Ladene Artist, MD,FACG;  Location: [facility];  Service: Endoscopy;  Laterality: N/A;   Family History  Problem Relation Age of Onset  . Colon cancer Neg Hx   . Rectal cancer Neg Hx   . Stomach cancer Neg Hx   . Colon polyps Mother   . Multiple sclerosis Mother   . Hemochromatosis Brother    Social History  Substance Use Topics  . Smoking status: Current Every Day Smoker -- 0.50 packs/day for 17 years    Types: Cigarettes  . Smokeless tobacco: Never Used     Comment: Tobacco info given 05/11/12  . Alcohol Use: Yes     Comment: occ. mixed drink on the weekends   OB History    No data available      Review of Systems  HENT: Positive for sore throat. Negative for trouble swallowing.   Respiratory: Negative for shortness of breath.   Gastrointestinal: Negative for nausea, vomiting and  abdominal pain.  All other systems reviewed and are negative.     Allergies  Other  Home Medications   Prior to Admission medications   Not on File    BP 135/83 mmHg  Pulse 90  Temp(Src) 98.1 F (36.7 C) (Oral)  Resp 18  SpO2 96%  LMP 07/24/2014 Physical Exam  Constitutional: She is oriented to person, place, and time. She appears well-developed and well-nourished. No distress.  HENT:  Head: Normocephalic and atraumatic.  Right Ear: External ear normal.  Left Ear: External ear normal.  Nose: Nose normal.  Mouth/Throat: Uvula is midline, oropharynx is clear and moist and mucous membranes are normal.  Eyes: Conjunctivae, EOM and lids are normal. Pupils are equal, round, and reactive to light. Right eye exhibits no discharge. Left eye exhibits no discharge. No scleral icterus.  Neck: Normal range of motion. Neck supple.  Cardiovascular: Normal rate, regular rhythm, normal heart sounds, intact distal pulses and normal pulses.   Pulmonary/Chest: Effort normal and breath sounds normal. No respiratory distress. She has no wheezes. She has no rales.  Abdominal: Soft. Normal appearance and bowel sounds are normal. She exhibits no distension and no mass. There is no tenderness. There is no rigidity, no rebound and no guarding.  Musculoskeletal: Normal range  of motion. She exhibits no edema or tenderness.  Neurological: She is alert and oriented to person, place, and time. She has normal strength. No cranial nerve deficit or sensory deficit.  Skin: Skin is warm, dry and intact. No rash noted. She is not diaphoretic. No erythema. No pallor.  Psychiatric: She has a normal mood and affect. Her speech is normal and behavior is normal.  Nursing note and vitals reviewed.   ED Course  Procedures (including critical care time)  Labs Review Labs Reviewed - No data to display  Imaging Review No results found.    EKG Interpretation None      MDM   Final diagnoses:  FB  mouth/esophag/stomach, initial encounter    50 year old female presents to the ED with FB sensation in her throat after swallowing a bone while eating steak tonight prior to arrival. Patient is afebrile. Vital signs stable. No erythema, edema, or exudate to posterior oropharynx. Heart regular rate and rhythm. Lungs clear to auscultation bilaterally. Abdomen soft, nontender, nondistended. Patient coughed up FB prior to my assessment and now reports significant symptom improvement, though notes her throat is still sore. Will give GI cocktail for symptom relief. On reassessment after GI cocktail, she states symptoms are resolved. Patient is non-toxic and well-appearing, feel she is stable for discharge at this time. Patient to follow-up with PCP. Return precautions discussed. Patient verbalizes her understanding and is in agreement with plan.  BP 135/83 mmHg  Pulse 90  Temp(Src) 98.1 F (36.7 C) (Oral)  Resp 18  SpO2 96%  LMP 07/24/2014     Marella Chimes, PA-C 01/29/16 0211  Shanon Rosser, MD 01/29/16 0230

## 2016-01-28 NOTE — ED Notes (Signed)
C/o bone stuck in throat, sharp, R side, c/o pain and effected swallowing, handling secretions and liquids, no dyspnea noted, dysphonia and dysphagia sensed by pt. Family at [facility]. Upon inspection, pt coughed up grissle-like substance and reported feels better, swallows more comfortably. Ice water given and tolerated.

## 2016-01-28 NOTE — Discharge Instructions (Signed)
1. Medications: usual home medications 2. Treatment: rest, drink plenty of fluids 3. Follow Up: please followup with your primary doctor for discussion of your diagnoses and further evaluation after today's visit; please return to the ER for new or worsening symptoms   Swallowed Foreign Body, Adult A swallowed foreign body is an object that gets stuck in the tube that connects your throat to your stomach (esophagus) or in another part of your digestive tract. Foreign bodies may be swallowed by accident or on purpose. When you swallow an object, it passes into your esophagus. The narrowest place in your digestive system is where your esophagus meets your stomach. If the object can pass through that place, it will usually continue through the rest of your digestive system without causing problems. A foreign body that gets stuck may need to be removed. It is very important to tell your health care provider what you have swallowed. Certain swallowed items can be life-threatening. You may need emergency treatment. Dangerous swallowed foreign bodies include:  Objects that get stuck in your throat.  Objects that interfere with your breathing.  Sharp objects.  Harmful objects, such as batteries or illegal drugs. CAUSES The most common swallowed foreign body is food that will not pass through your esophagus to your stomach (food impaction). Foods that commonly become impacted include meats and hard vegetables, such as carrots and radishes. Other common swallowed foreign bodies include:  Pieces of bone from meats.  Toothpicks.  Dentures. RISK FACTORS You are more likely to have a swallowed foreign body if:  You wear dentures.  You have been drinking alcohol or taking drugs.  You have a mental health condition.  You have a narrowed or scarred area in your digestive tract. SYMPTOMS  Pain or pressure in your throat or chest.  Not being able to swallow food or liquid.  Not being able to  swallow your saliva.  Choking.  Hoarse voice.  Trouble breathing. DIAGNOSIS This condition may be diagnosed based on your symptoms and medical history. Your health care provider will do a physical exam to confirm the diagnosis and to find the object. Imaging studies may be done, including:  X-rays.  A CT scan. Some objects may not be seen on imaging studies. In those cases, an exam may be done using a long tubelike scope to look into your esophagus (endoscopy). The tube (endoscope) that is used for this exam may be stiff (rigid) or flexible, depending on where the foreign body is stuck. TREATMENT Usually, an object that has passed into your stomach but is not dangerous will pass out of your digestive system without treatment. If the swallowed object is not dangerous but it is stuck in your esophagus:  You may be given medicine to relax the muscles of your esophagus to allow the object to pass through.  Endoscopy may be done to find and remove the object if it does not pass with medicine. Your health care provider will put medical instruments through the endoscope to remove the object. You may need emergency treatment if:  The object is in your esophagus and is causing you to inhale saliva into your lungs (aspirate).  The object is in your esophagus and is pressing on your airway. This makes it hard for you to breathe.  The object can damage your digestive tract. Some objects that can cause damage include batteries, magnets, sharp objects, and drugs. HOME CARE INSTRUCTIONS If the object in your digestive system is expected to pass:  Continue  eating what you usually eat, unless your health care provider gives you different instructions.  Check your stool after every bowel movement to see if you have passed the object.  Contact your health care provider if the object has not passed after 3 days. If you had endoscopic surgery to remove the foreign body:  Follow instructions from  your health care provider about caring for yourself after the procedure. Keep all follow-up visits and repeat imaging tests as told by your health care provider. This is important. SEEK MEDICAL CARE IF:  You continue to have symptoms of a swallowed foreign body.  The object has not passed out of your body after 3 days. SEEK IMMEDIATE MEDICAL CARE IF:  You have a fever.  You have pain in your chest or your abdomen.  You cough up blood.  You have blood in your stool (feces) or your vomit.   This information is not intended to replace advice given to you by your health care provider. Make sure you discuss any questions you have with your health care provider.   Document Released: 03/20/2010 Document Revised: 06/21/2015 Document Reviewed: 12/28/2014 Elsevier Interactive Patient Education [employer].

## 2016-01-28 NOTE — ED Notes (Signed)
ED PA at BS 

## 2016-04-23 ENCOUNTER — Other Ambulatory Visit: Payer: Self-pay | Admitting: *Deleted

## 2016-04-23 MED ORDER — ALPRAZOLAM 1 MG PO TABS: 1.0000 mg | ORAL_TABLET | Freq: Every evening | ORAL | Status: DC | PRN

## 2016-06-10 ENCOUNTER — Encounter: Payer: Self-pay | Admitting: Physician Assistant

## 2016-06-10 ENCOUNTER — Ambulatory Visit (INDEPENDENT_AMBULATORY_CARE_PROVIDER_SITE_OTHER): Payer: 59 | Admitting: Physician Assistant

## 2016-06-10 VITALS — BP 124/90 | HR 71 | Temp 97.7°F | Resp 16 | Ht 60.5 in | Wt 165.2 lb

## 2016-06-10 DIAGNOSIS — R197 Diarrhea, unspecified: Secondary | ICD-10-CM

## 2016-06-10 DIAGNOSIS — F410 Panic disorder [episodic paroxysmal anxiety] without agoraphobia: Secondary | ICD-10-CM

## 2016-06-10 DIAGNOSIS — R413 Other amnesia: Secondary | ICD-10-CM | POA: Diagnosis not present

## 2016-06-10 DIAGNOSIS — R350 Frequency of micturition: Secondary | ICD-10-CM | POA: Diagnosis not present

## 2016-06-10 LAB — HEPATIC FUNCTION PANEL
ALT: 8 U/L (ref 6–29)
AST: 11 U/L (ref 10–35)
Albumin: 4.2 g/dL (ref 3.6–5.1)
Alkaline Phosphatase: 69 U/L (ref 33–130)
Bilirubin, Direct: 0 mg/dL (ref <=0.2)
Indirect Bilirubin: 0.3 mg/dL (ref 0.2–1.2)
Total Bilirubin: 0.3 mg/dL (ref 0.2–1.2)
Total Protein: 6.7 g/dL (ref 6.1–8.1)

## 2016-06-10 LAB — CBC WITH DIFFERENTIAL/PLATELET
Basophils Absolute: 0 cells/uL (ref 0–200)
Basophils Relative: 0 %
Eosinophils Absolute: 210 cells/uL (ref 15–500)
Eosinophils Relative: 2 %
HCT: 43.5 % (ref 35.0–45.0)
Hemoglobin: 15.1 g/dL (ref 11.7–15.5)
Lymphocytes Relative: 27 %
Lymphs Abs: 2835 cells/uL (ref 850–3900)
MCH: 32 pg (ref 27.0–33.0)
MCHC: 34.7 g/dL (ref 32.0–36.0)
MCV: 92.2 fL (ref 80.0–100.0)
MPV: 10.4 fL (ref 7.5–12.5)
Monocytes Absolute: 735 cells/uL (ref 200–950)
Monocytes Relative: 7 %
Neutro Abs: 6720 cells/uL (ref 1500–7800)
Neutrophils Relative %: 64 %
Platelets: 379 10*3/uL (ref 140–400)
RBC: 4.72 MIL/uL (ref 3.80–5.10)
RDW: 13.6 % (ref 11.0–15.0)
WBC: 10.5 10*3/uL (ref 3.8–10.8)

## 2016-06-10 LAB — BASIC METABOLIC PANEL WITH GFR
BUN: 11 mg/dL (ref 7–25)
CO2: 22 mmol/L (ref 20–31)
Calcium: 9.5 mg/dL (ref 8.6–10.4)
Chloride: 107 mmol/L (ref 98–110)
Creat: 0.66 mg/dL (ref 0.50–1.05)
GFR, Est African American: >89 mL/min (ref >=60)
GFR, Est Non African American: >89 mL/min (ref >=60)
Glucose, Bld: 92 mg/dL (ref 65–99)
Potassium: 4.4 mmol/L (ref 3.5–5.3)
Sodium: 139 mmol/L (ref 135–146)

## 2016-06-10 LAB — TSH: TSH: 1.65 mIU/L

## 2016-06-10 MED ORDER — SERTRALINE HCL 50 MG PO TABS: 50.0000 mg | ORAL_TABLET | Freq: Every day | ORAL | 2 refills | Status: DC

## 2016-06-10 MED ORDER — ONDANSETRON HCL 4 MG PO TABS: 4.0000 mg | ORAL_TABLET | Freq: Three times a day (TID) | ORAL | 2 refills | Status: DC | PRN

## 2016-06-10 MED ORDER — PROMETHAZINE HCL 25 MG PO TABS: 25.0000 mg | ORAL_TABLET | Freq: Four times a day (QID) | ORAL | 3 refills | Status: DC | PRN

## 2016-06-10 MED ORDER — ALPRAZOLAM 1 MG PO TABS: 1.0000 mg | ORAL_TABLET | Freq: Every evening | ORAL | 1 refills | Status: DC | PRN

## 2016-06-10 MED ORDER — HYOSCYAMINE SULFATE 0.125 MG PO TABS: 0.1250 mg | ORAL_TABLET | ORAL | 2 refills | Status: DC | PRN

## 2016-06-10 NOTE — Progress Notes (Signed)
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 xanax PRN, strongly suggest counseling, very close follow up here 4 weeks.  Post menopausal bleeding- has follow up GYN  Prolonged visit grief, over 60 mins spent in the room with the patient.   HPI 50 y.o.female presents anxiety. 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 She is waking up in the middle of the night gasping for air, sweating, and feeling short of breath. She is having post menopausal bleeding and seeing her GYN next week.    Medications: Current Outpatient Prescriptions on File Prior to Visit  Medication Sig Dispense Refill  . ALPRAZolam (XANAX) 1 MG tablet Take 1 tablet (1 mg total) by mouth at bedtime as needed for anxiety. 30 tablet 0   No current facility-administered medications on file prior to visit.     ROS: All negative except for above  Physical Exam  Past Medical History:  Diagnosis Date  . GERD (gastroesophageal reflux disease)    occ s/s  . Hyperlipidemia   . Ulcer   . Vitamin D deficiency     ROS: all negative except above.   Physical Exam: Filed Weights   06/10/16 1101  Weight: 165 lb 3.2 oz (74.9 kg)   BP 124/90   Pulse 71   Temp 97.7 F (36.5 C)   Resp 16   Ht 5' 0.5" (1.537 m)   Wt 165 lb 3.2 oz (74.9 kg)   LMP 07/24/2014   SpO2 99%   BMI 31.73 kg/m  General Appearance: Well nourished, in no apparent distress. Eyes: PERRLA, EOMs, conjunctiva no swelling or erythema Sinuses: No Frontal/maxillary  tenderness ENT/Mouth: Ext aud canals clear, TMs without erythema, bulging. No erythema, swelling, or exudate on post pharynx.  Tonsils not swollen or erythematous. Hearing normal.  Neck: Supple, thyroid normal.  Respiratory: Respiratory effort normal, BS equal bilaterally without rales, rhonchi, wheezing or stridor.  Cardio: RRR with no MRGs. Brisk peripheral pulses without edema.  Abdomen: Soft, + BS.  Non tender, no guarding, rebound, hernias, masses. Lymphatics: Non tender without lymphadenopathy.  Musculoskeletal: Full ROM, 5/5 strength, normal gait.  Skin: Warm, dry without rashes, lesions, ecchymosis.  Neuro: Cranial nerves intact. Normal muscle tone, no cerebellar symptoms. Sensation intact.  Psych: Awake and oriented X 3, crying, Insight and Judgment appropriate.     Vicie Mutters, PA-C 11:25 AM [facility]

## 2016-06-10 NOTE — Patient Instructions (Signed)
[facility] Address: [address] [place], [place] [address]  Phone: [phone]  Counseling services  Dr. Arbutus Leas, Ph.D. [address], [place] [address] Phone: [phone],  [id number] [place] [address] [place] [address]   [facility] Hours: Monday-Thursday 830-8pm  Friday 830AM-7PM Address: [place] [phone] [place].  Address: [place], [place] [address] [facility] [phone] office [email or web address] [address], [place], [place], [place] [address]  Rema Fendt, therapist  Toy Cookey, MA, clinical psychologist  Cognitive-Behavior Therapy; Mood Disorders; Anxiety Disorders; adult and child ADHD; Family Therapy; Stress Management; personal growth, and Marital Therapy.    Terrance Mass Ph.D., clinical psychologist Cognitive-Behavior Therapy; Mood Disorders; Anxiety Disorders; Stress     Management  [facility] [address] [place], [place] [address] [phone]   [facility] [place], psychotherapist [address] [place], [place] [address] [phone]  Karin Golden Ph.D., clinical psychologist [phone] office [place],  [address] Cognitive Behavior Therapy, Depression, Bipolar, Anxiety, Grief and Loss

## 2016-06-11 LAB — URINALYSIS, ROUTINE W REFLEX MICROSCOPIC
Bilirubin Urine: NEGATIVE
Glucose, UA: NEGATIVE
Hgb urine dipstick: NEGATIVE
Ketones, ur: NEGATIVE
Nitrite: NEGATIVE
Protein, ur: NEGATIVE
Specific Gravity, Urine: 1.015 (ref 1.001–1.035)
pH: 5 (ref 5.0–8.0)

## 2016-06-11 LAB — URINALYSIS, MICROSCOPIC ONLY
Casts: NONE SEEN [LPF]
Crystals: NONE SEEN [HPF]
RBC / HPF: NONE SEEN RBC/HPF (ref <=2)
Yeast: NONE SEEN [HPF]

## 2016-07-08 ENCOUNTER — Ambulatory Visit: Payer: Self-pay | Admitting: Physician Assistant

## 2016-09-03 ENCOUNTER — Ambulatory Visit (INDEPENDENT_AMBULATORY_CARE_PROVIDER_SITE_OTHER): Payer: 59 | Admitting: Physician Assistant

## 2016-09-03 ENCOUNTER — Encounter: Payer: Self-pay | Admitting: Physician Assistant

## 2016-09-03 VITALS — BP 132/80 | HR 84 | Temp 98.0°F | Resp 16 | Ht 60.5 in | Wt 168.0 lb

## 2016-09-03 DIAGNOSIS — D649 Anemia, unspecified: Secondary | ICD-10-CM

## 2016-09-03 DIAGNOSIS — Z0000 Encounter for general adult medical examination without abnormal findings: Secondary | ICD-10-CM

## 2016-09-03 DIAGNOSIS — Z0001 Encounter for general adult medical examination with abnormal findings: Secondary | ICD-10-CM

## 2016-09-03 DIAGNOSIS — Z8371 Family history of colonic polyps: Secondary | ICD-10-CM

## 2016-09-03 DIAGNOSIS — K2100 Gastro-esophageal reflux disease with esophagitis, without bleeding: Secondary | ICD-10-CM

## 2016-09-03 DIAGNOSIS — E785 Hyperlipidemia, unspecified: Secondary | ICD-10-CM

## 2016-09-03 DIAGNOSIS — F17200 Nicotine dependence, unspecified, uncomplicated: Secondary | ICD-10-CM

## 2016-09-03 DIAGNOSIS — R7309 Other abnormal glucose: Secondary | ICD-10-CM

## 2016-09-03 DIAGNOSIS — E559 Vitamin D deficiency, unspecified: Secondary | ICD-10-CM

## 2016-09-03 DIAGNOSIS — K269 Duodenal ulcer, unspecified as acute or chronic, without hemorrhage or perforation: Secondary | ICD-10-CM

## 2016-09-03 DIAGNOSIS — Z79899 Other long term (current) drug therapy: Secondary | ICD-10-CM

## 2016-09-03 LAB — CBC WITH DIFFERENTIAL/PLATELET
Basophils Absolute: 0 cells/uL (ref 0–200)
Basophils Relative: 0 %
Eosinophils Absolute: 396 cells/uL (ref 15–500)
Eosinophils Relative: 4 %
HCT: 43.6 % (ref 35.0–45.0)
Hemoglobin: 14.8 g/dL (ref 11.7–15.5)
Lymphocytes Relative: 29 %
Lymphs Abs: 2871 cells/uL (ref 850–3900)
MCH: 31.5 pg (ref 27.0–33.0)
MCHC: 33.9 g/dL (ref 32.0–36.0)
MCV: 92.8 fL (ref 80.0–100.0)
MPV: 9.5 fL (ref 7.5–12.5)
Monocytes Absolute: 693 cells/uL (ref 200–950)
Monocytes Relative: 7 %
Neutro Abs: 5940 cells/uL (ref 1500–7800)
Neutrophils Relative %: 60 %
Platelets: 363 10*3/uL (ref 140–400)
RBC: 4.7 MIL/uL (ref 3.80–5.10)
RDW: 14 % (ref 11.0–15.0)
WBC: 9.9 10*3/uL (ref 3.8–10.8)

## 2016-09-03 NOTE — Patient Instructions (Addendum)
Call Dr. Fuller Plan and set up colonoscopy Phone: [phone];  Here is some information to help you keep your heart healthy: Move it! - Aim for 30 mins of activity every day. Take it slowly at first. Talk to [place] before starting any new exercise program.   Lose it.  -Body Mass Index (BMI) can indicate if you need to lose weight. A healthy range is 18.5-24.9. For a BMI calculator, go to [email or web address].com  Waist Management -Excess abdominal fat is a risk factor for heart disease, diabetes, asthma, stroke and more. Ideal waist circumference is less than 35" for women and less than 40" for men.   Eat Right -focus on fruits, vegetables, whole grains, and meals you make yourself. Avoid foods with trans fat and high sugar/sodium content.   Snooze or Snore? - Loud snoring can be a sign of sleep apnea, a significant risk factor for high blood pressure, heart attach, stroke, and heart arrhythmias.  Kick the habit -Quit Smoking! Avoid second hand smoke. A single cigarette raises your blood pressure for 20 mins and increases the risk of heart attack and stroke for the next 24 hours.   Are Aspirin and Supplements right for you? -Add ENTERIC COATED low dose 81 mg Aspirin daily OR can do every other day if you have easy bruising to protect your heart and head. As well as to reduce risk of Colon Cancer by 20 %, Skin Cancer by 26 % , Melanoma by 46% and Pancreatic cancer by 60%  Say "No to Stress -There may be little you can do about problems that cause stress. However, techniques such as long walks, meditation, and exercise can help you manage it.   Start Now! - Make changes one at a time and set reasonable goals to increase your likelihood of success.    We want weight loss that will last so you should lose 1-2 pounds a week.  THAT IS IT! Please pick THREE things a month to change. Once it is a habit check off the item. Then pick another three items off the list to become habits.  If you are already doing a  habit on the list GREAT!  Cross that item off! o Don't drink your calories. Ie, alcohol, soda, fruit juice, and sweet tea.  o Drink more water. Drink a glass when you feel hungry or before each meal.  o Eat breakfast - Complex carb and protein (likeDannon light and fit yogurt, oatmeal, fruit, eggs, [place] bacon). o Measure your cereal.  Eat no more than one cup a day. (ie [place]) o Eat an apple a day. o Add a vegetable a day. o Try a new vegetable a month. o Use Pam! Stop using oil or butter to cook. o Don't finish your plate or use smaller plates. o Share your dessert. o Eat sugar free Jello for dessert or frozen grapes. o Don't eat 2-3 hours before bed. o Switch to whole wheat bread, pasta, and brown rice. o Make healthier choices when you eat out. No fries! o Pick baked chicken, NOT fried. o Don't forget to SLOW DOWN when you eat. It is not going anywhere.  o Take the stairs. o Park far away in the parking lot o [employer] (or weights) for 10 minutes while watching TV. o Walk at work for 10 minutes during break. o Walk outside 1 time a week with your friend, kids, dog, or significant other. o Start a walking group at [place] the mall as much  as you can tolerate.  o Keep a food diary. o Weigh yourself daily. o Walk for 15 minutes 3 days per week. o Cook at home more often and eat out less.  If life happens and you go back to old habits, it is okay.  Just start over. You can do it!   If you experience chest pain, get short of breath, or tired during the exercise, please stop immediately and inform your doctor.

## 2016-09-03 NOTE — Progress Notes (Signed)
Complete Physical  Assessment and Plan:  Hyperlipidemia Check labs, diet discussed - CBC with Differential/Platelet - BASIC METABOLIC PANEL WITH GFR - Hepatic function panel - TSH - Lipid panel  Vitamin D deficiency - VITAMIN D 25 Hydroxy (Vit-D Deficiency, Fractures)  Gastroesophageal reflux disease with esophagitis PPI occ, diet discussed, H2 otherwise  Tobacco use disorder Smoking cessation-  instruction/counseling given, counseled patient on the dangers of tobacco use, advised patient to stop smoking, and reviewed strategies to maximize success, patient not ready to quit at this time.  - Urinalysis, Routine w reflex microscopic (not at [facility]) - Microalbumin / creatinine urine ratio  Other abnormal glucose  long discussion about diet, and exercise - TSH - Hemoglobin A1c - Insulin, fasting - Urinalysis, Routine w reflex microscopic (not at [facility]) - Microalbumin / creatinine urine ratio  Duodenal ulcer Treated Hpylori, nexium, occ, avoid alcohol and NSAIDS  Diarrhea, unspecified type Get on benefiber, diet discussed Get colonoscopy  Family history of colonic polyps Call Dr. Fuller Plan to see if 3 or 5 years due for colonoscopy   Encounter for general adult medical examination with abnormal findings Up to date, declines flu vaccine, call Dr. Fuller Plan - CBC with Differential/Platelet - BASIC METABOLIC PANEL WITH GFR - Hepatic function panel - TSH - Lipid panel - Hemoglobin A1c - Insulin, fasting - Magnesium - VITAMIN D 25 Hydroxy (Vit-D Deficiency, Fractures) - Urinalysis, Routine w reflex microscopic (not at [facility]) - Microalbumin / creatinine urine ratio - Iron and TIBC - Ferritin - Vitamin B12  Medication management - Magnesium  Obesity - long discussion about weight loss, diet, and exercise  Discussed med's effects and SE's. Screening labs and tests as requested with regular follow-up as recommended. Over 40 minutes of exam, counseling, chart review, and  complex, high level critical decision making was performed this visit.   HPI  50 y.o. W female  presents for a complete physical.  Her blood pressure has been controlled at home, today their BP is BP: 132/80  Mom passed in may, job was stressful so she took severance and will enjoy a few weeks off until she starts to look for new job.   She does not workout but works out in her yard. She denies chest pain, shortness of breath, dizziness.  She is not on cholesterol medication and denies myalgias. Her cholesterol is at goal. Last checked  Lab Results  Component Value Date   CHOL 184 08/31/2015   HDL 48 08/31/2015   LDLCALC 119 08/31/2015   TRIG 87 08/31/2015   CHOLHDL 3.8 08/31/2015    She has been working on diet and exercise for prediabetes, and denies paresthesia of the feet, polydipsia, polyuria and visual disturbances.  Lab Results  Component Value Date   HGBA1C 5.9 (H) 08/31/2015   Patient is not on Vitamin D supplement.   Occ leg cramping at night BMI is Body mass index is 32.27 kg/m., she is working on diet and exercise. Wt Readings from Last 3 Encounters:  09/03/16 168 lb (76.2 kg)  06/10/16 165 lb 3.2 oz (74.9 kg)  08/31/15 174 lb (78.9 kg)    Current Medications:  Current Outpatient Prescriptions on File Prior to Visit  Medication Sig Dispense Refill  . ALPRAZolam (XANAX) 1 MG tablet Take 1 tablet (1 mg total) by mouth at bedtime as needed for anxiety. 30 tablet 1   No current facility-administered medications on file prior to visit.    Health Maintenance:   Immunization History  Administered Date(s) Administered  .  Tdap 07/29/2011   Tetanus: 2012 Pneumovax: N/A Prevnar 13:  N/A Flu vaccine: declines Zostavax: N/A  LMP: 6 months ago Pap: 06/2016 MGM: Sees Dr. Philis Pique, 10/2015 DEXA: N/A Colonoscopy: 2013 due 2016 or 2018, will call Dr. Fuller Plan EGD: N/A MRI brain: 2011 CXr 2009  Last Dental Exam: yes, q 6 months Last Eye Exam: Chares burns, glasses,  went to eye mart once time Patient Care Team: Unk Pinto, MD as PCP - General (Internal Medicine) Ladene Artist, MD as Consulting Physician (Gastroenterology) Bobbye Charleston, MD as Consulting Physician (Obstetrics and Gynecology)  Medical History:  Past Medical History:  Diagnosis Date  . GERD (gastroesophageal reflux disease)    occ s/s  . Hyperlipidemia   . Ulcer ([place])   . Vitamin D deficiency    Allergies Allergies  Allergen Reactions  . Other     Stitches used after growth on back removed, caused redness and itching    SURGICAL HISTORY She  has a past surgical history that includes Lumbar disc surgery; Cesarean section (1985, 1993); and Esophagogastroduodenoscopy (06/03/2012). FAMILY HISTORY Her family history includes Colon polyps in her mother; Hemochromatosis in her brother; Multiple sclerosis in her mother. SOCIAL HISTORY She  reports that she has been smoking Cigarettes.  She has a 8.50 pack-year smoking history. She has never used smokeless tobacco. She reports that she drinks alcohol. She reports that she does not use drugs.   Review of Systems: Review of Systems  Constitutional: Negative.   HENT: Negative.   Eyes: Negative.   Respiratory: Negative.  Negative for cough and shortness of breath.   Cardiovascular: Negative.  Negative for chest pain.  Gastrointestinal: Negative for abdominal pain, blood in stool, constipation, diarrhea, heartburn, melena, nausea and vomiting.  Genitourinary: Negative.   Musculoskeletal: Positive for myalgias (legs at night occ). Negative for back pain, falls, joint pain and neck pain.  Skin: Negative.   Neurological: Negative.   Psychiatric/Behavioral: Negative.  Negative for depression.    Physical Exam: Estimated body mass index is 32.27 kg/m as calculated from the following:   Height as of this encounter: 5' 0.5" (1.537 m).   Weight as of this encounter: 168 lb (76.2 kg). BP 132/80   Pulse 84   Temp 98 F (36.7  C) (Temporal)   Resp 16   Ht 5' 0.5" (1.537 m)   Wt 168 lb (76.2 kg)   LMP 07/24/2014   BMI 32.27 kg/m  General Appearance: Well nourished, in no apparent distress.  Eyes: PERRLA, EOMs, conjunctiva no swelling or erythema, normal fundi and vessels.  Sinuses: No Frontal/maxillary tenderness  ENT/Mouth: Ext aud canals clear, normal light reflex with TMs without erythema, bulging. Good dentition. No erythema, swelling, or exudate on post pharynx. Tonsils not swollen or erythematous. Hearing normal.  Neck: Supple, thyroid normal. No bruits  Respiratory: Respiratory effort normal, BS equal bilaterally without rales, rhonchi, wheezing or stridor.  Cardio: RRR without murmurs, rubs or gallops. Brisk peripheral pulses without edema.  Chest: symmetric, with normal excursions and percussion.  Breasts: defer GYN Abdomen: Soft, nontender, no guarding, rebound, hernias, masses, or organomegaly.  Lymphatics: Non tender without lymphadenopathy.  Genitourinary: defer GYN Musculoskeletal: Full ROM all peripheral extremities,5/5 strength, and normal gait.  Skin: Warm, dry without rashes, lesions, ecchymosis. Neuro: Cranial nerves intact, reflexes equal bilaterally. Normal muscle tone, no cerebellar symptoms. Sensation intact.  Psych: Awake and oriented X 3, normal affect, Insight and Judgment appropriate.   EKG: declines will get next year with CXR AORTA SCAN:  N/A  Vicie Mutters 2:14 PM [facility]

## 2016-09-04 LAB — BASIC METABOLIC PANEL WITH GFR
BUN: 12 mg/dL (ref 7–25)
CO2: 30 mmol/L (ref 20–31)
Calcium: 9.4 mg/dL (ref 8.6–10.4)
Chloride: 103 mmol/L (ref 98–110)
Creat: 0.66 mg/dL (ref 0.50–1.05)
GFR, Est African American: >89 mL/min (ref >=60)
GFR, Est Non African American: >89 mL/min (ref >=60)
Glucose, Bld: 76 mg/dL (ref 65–99)
Potassium: 4.3 mmol/L (ref 3.5–5.3)
Sodium: 139 mmol/L (ref 135–146)

## 2016-09-04 LAB — IRON AND TIBC
%SAT: 32 % (ref 11–50)
Iron: 107 ug/dL (ref 45–160)
TIBC: 337 ug/dL (ref 250–450)
UIBC: 230 ug/dL (ref 125–400)

## 2016-09-04 LAB — MICROALBUMIN / CREATININE URINE RATIO
Creatinine, Urine: 59 mg/dL (ref 20–320)
Microalb Creat Ratio: 3 mcg/mg creat (ref <30)
Microalb, Ur: 0.2 mg/dL

## 2016-09-04 LAB — MAGNESIUM: Magnesium: 2.1 mg/dL (ref 1.5–2.5)

## 2016-09-04 LAB — LIPID PANEL
Cholesterol: 237 mg/dL — ABNORMAL HIGH (ref <200)
HDL: 54 mg/dL (ref >50)
LDL Cholesterol: 161 mg/dL — ABNORMAL HIGH (ref <100)
Total CHOL/HDL Ratio: 4.4 Ratio (ref <5.0)
Triglycerides: 110 mg/dL (ref <150)
VLDL: 22 mg/dL (ref <30)

## 2016-09-04 LAB — HEPATIC FUNCTION PANEL
ALT: 11 U/L (ref 6–29)
AST: 16 U/L (ref 10–35)
Albumin: 4.4 g/dL (ref 3.6–5.1)
Alkaline Phosphatase: 67 U/L (ref 33–130)
Bilirubin, Direct: 0.1 mg/dL (ref <=0.2)
Indirect Bilirubin: 0.2 mg/dL (ref 0.2–1.2)
Total Bilirubin: 0.3 mg/dL (ref 0.2–1.2)
Total Protein: 7.1 g/dL (ref 6.1–8.1)

## 2016-09-04 LAB — URINALYSIS, ROUTINE W REFLEX MICROSCOPIC
Bilirubin Urine: NEGATIVE
Glucose, UA: NEGATIVE
Hgb urine dipstick: NEGATIVE
Ketones, ur: NEGATIVE
Leukocytes, UA: NEGATIVE
Nitrite: NEGATIVE
Protein, ur: NEGATIVE
Specific Gravity, Urine: 1.011 (ref 1.001–1.035)
pH: 5.5 (ref 5.0–8.0)

## 2016-09-04 LAB — FERRITIN: Ferritin: 41 ng/mL (ref 10–232)

## 2016-09-04 LAB — HEMOGLOBIN A1C
Hgb A1c MFr Bld: 5.3 % (ref <5.7)
Mean Plasma Glucose: 105 mg/dL

## 2016-09-04 LAB — TSH: TSH: 1.93 mIU/L

## 2016-09-04 LAB — VITAMIN D 25 HYDROXY (VIT D DEFICIENCY, FRACTURES): Vit D, 25-Hydroxy: 22 ng/mL — ABNORMAL LOW (ref 30–100)

## 2016-09-04 LAB — INSULIN, FASTING: Insulin fasting, serum: 9 u[IU]/mL (ref 2.0–19.6)

## 2016-10-16 ENCOUNTER — Ambulatory Visit: Payer: 59 | Admitting: Nurse Practitioner

## 2017-01-02 DIAGNOSIS — N950 Postmenopausal bleeding: Secondary | ICD-10-CM | POA: Diagnosis not present

## 2017-03-10 NOTE — Progress Notes (Deleted)
Complete Physical  Assessment and Plan:  Hyperlipidemia Check labs, diet discussed - CBC with Differential/Platelet - BASIC METABOLIC PANEL WITH GFR - Hepatic function panel - TSH - Lipid panel  Vitamin D deficiency - VITAMIN D 25 Hydroxy (Vit-D Deficiency, Fractures)  Tobacco use disorder Smoking cessation-  instruction/counseling given, counseled patient on the dangers of tobacco use, advised patient to stop smoking, and reviewed strategies to maximize success, patient not ready to quit at this time.  - Urinalysis, Routine w reflex microscopic (not at [facility]) - Microalbumin / creatinine urine ratio  Other abnormal glucose  long discussion about diet, and exercise - TSH - Hemoglobin A1c  Medication management - Magnesium  Obesity - long discussion about weight loss, diet, and exercise  Discussed med's effects and SE's. Screening labs and tests as requested with regular follow-up as recommended. Over 30 minutes of exam, counseling, chart review, and complex, high level critical decision making was performed this visit.   HPI  51 y.o. W female  presents for 6 month follow up.  Her blood pressure has been controlled at home, today their BP is    Mom passed in may, job was stressful so she took severance and will enjoy a few weeks off until she starts to look for new job.   She does not workout but works out in her yard. She denies chest pain, shortness of breath, dizziness.  She is not on cholesterol medication and denies myalgias. Her cholesterol is at goal. Last checked  Lab Results  Component Value Date   CHOL 237 (H) 09/03/2016   HDL 54 09/03/2016   LDLCALC 161 (H) 09/03/2016   TRIG 110 09/03/2016   CHOLHDL 4.4 09/03/2016    She has been working on diet and exercise for prediabetes, and denies paresthesia of the feet, polydipsia, polyuria and visual disturbances.  Lab Results  Component Value Date   HGBA1C 5.3 09/03/2016   Patient is not on Vitamin D supplement.    Occ leg cramping at night BMI is There is no height or weight on file to calculate BMI., she is working on diet and exercise. Wt Readings from Last 3 Encounters:  09/03/16 168 lb (76.2 kg)  06/10/16 165 lb 3.2 oz (74.9 kg)  08/31/15 174 lb (78.9 kg)    Current Medications:  Current Outpatient Prescriptions on File Prior to Visit  Medication Sig Dispense Refill  . ALPRAZolam (XANAX) 1 MG tablet Take 1 tablet (1 mg total) by mouth at bedtime as needed for anxiety. 30 tablet 1   No current facility-administered medications on file prior to visit.    Medical History:  Past Medical History:  Diagnosis Date  . GERD (gastroesophageal reflux disease)    occ s/s  . Hyperlipidemia   . Ulcer ([place])   . Vitamin D deficiency    Allergies Allergies  Allergen Reactions  . Other     Stitches used after growth on back removed, caused redness and itching    Review of Systems: Review of Systems  Constitutional: Negative.   HENT: Negative.   Eyes: Negative.   Respiratory: Negative.  Negative for cough and shortness of breath.   Cardiovascular: Negative.  Negative for chest pain.  Gastrointestinal: Negative for abdominal pain, blood in stool, constipation, diarrhea, heartburn, melena, nausea and vomiting.  Genitourinary: Negative.   Musculoskeletal: Positive for myalgias (legs at night occ). Negative for back pain, falls, joint pain and neck pain.  Skin: Negative.   Neurological: Negative.   Psychiatric/Behavioral: Negative.  Negative  for depression.    Physical Exam: Estimated body mass index is 32.27 kg/m as calculated from the following:   Height as of 09/03/16: 5' 0.5" (1.537 m).   Weight as of 09/03/16: 168 lb (76.2 kg). LMP 07/24/2014  General Appearance: Well nourished, in no apparent distress.  Eyes: PERRLA, EOMs, conjunctiva no swelling or erythema, normal fundi and vessels.  Sinuses: No Frontal/maxillary tenderness  ENT/Mouth: Ext aud canals clear, normal light reflex  with TMs without erythema, bulging. Good dentition. No erythema, swelling, or exudate on post pharynx. Tonsils not swollen or erythematous. Hearing normal.  Neck: Supple, thyroid normal. No bruits  Respiratory: Respiratory effort normal, BS equal bilaterally without rales, rhonchi, wheezing or stridor.  Cardio: RRR without murmurs, rubs or gallops. Brisk peripheral pulses without edema.  Chest: symmetric, with normal excursions and percussion.  Abdomen: Soft, nontender, no guarding, rebound, hernias, masses, or organomegaly.  Lymphatics: Non tender without lymphadenopathy.  Musculoskeletal: Full ROM all peripheral extremities,5/5 strength, and normal gait.  Skin: Warm, dry without rashes, lesions, ecchymosis. Neuro: Cranial nerves intact, reflexes equal bilaterally. Normal muscle tone, no cerebellar symptoms. Sensation intact.  Psych: Awake and oriented X 3, normal affect, Insight and Judgment appropriate.    Vicie Mutters 4:35 PM [facility]

## 2017-03-12 ENCOUNTER — Ambulatory Visit: Payer: Self-pay | Admitting: Physician Assistant

## 2017-04-16 IMAGING — CR DG CHEST 2V
2 series · 2 of 2 positions shown · non-contrast
Comparison: 07/26/2008

CLINICAL DATA: Tobacco use disorder  PS 07-26-08  No cough or pain

EXAM:
CHEST  2 VIEW

[chest pa]
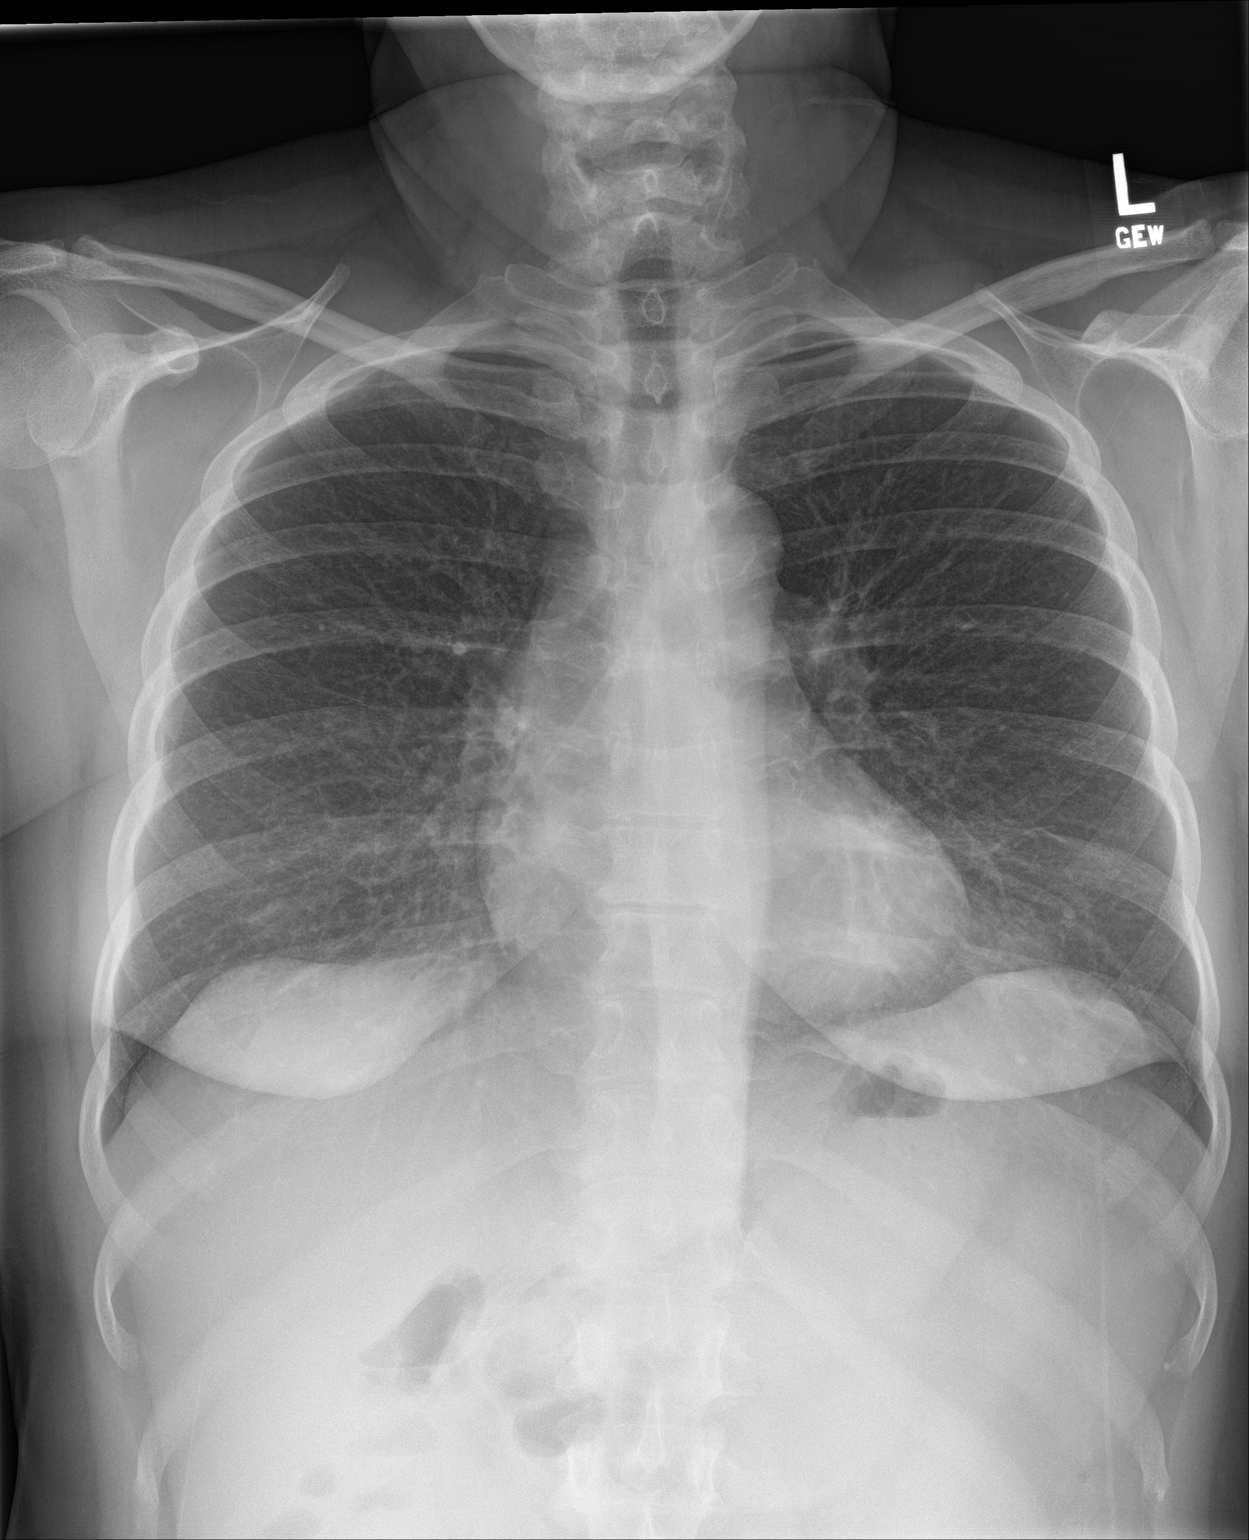

[chest lat]
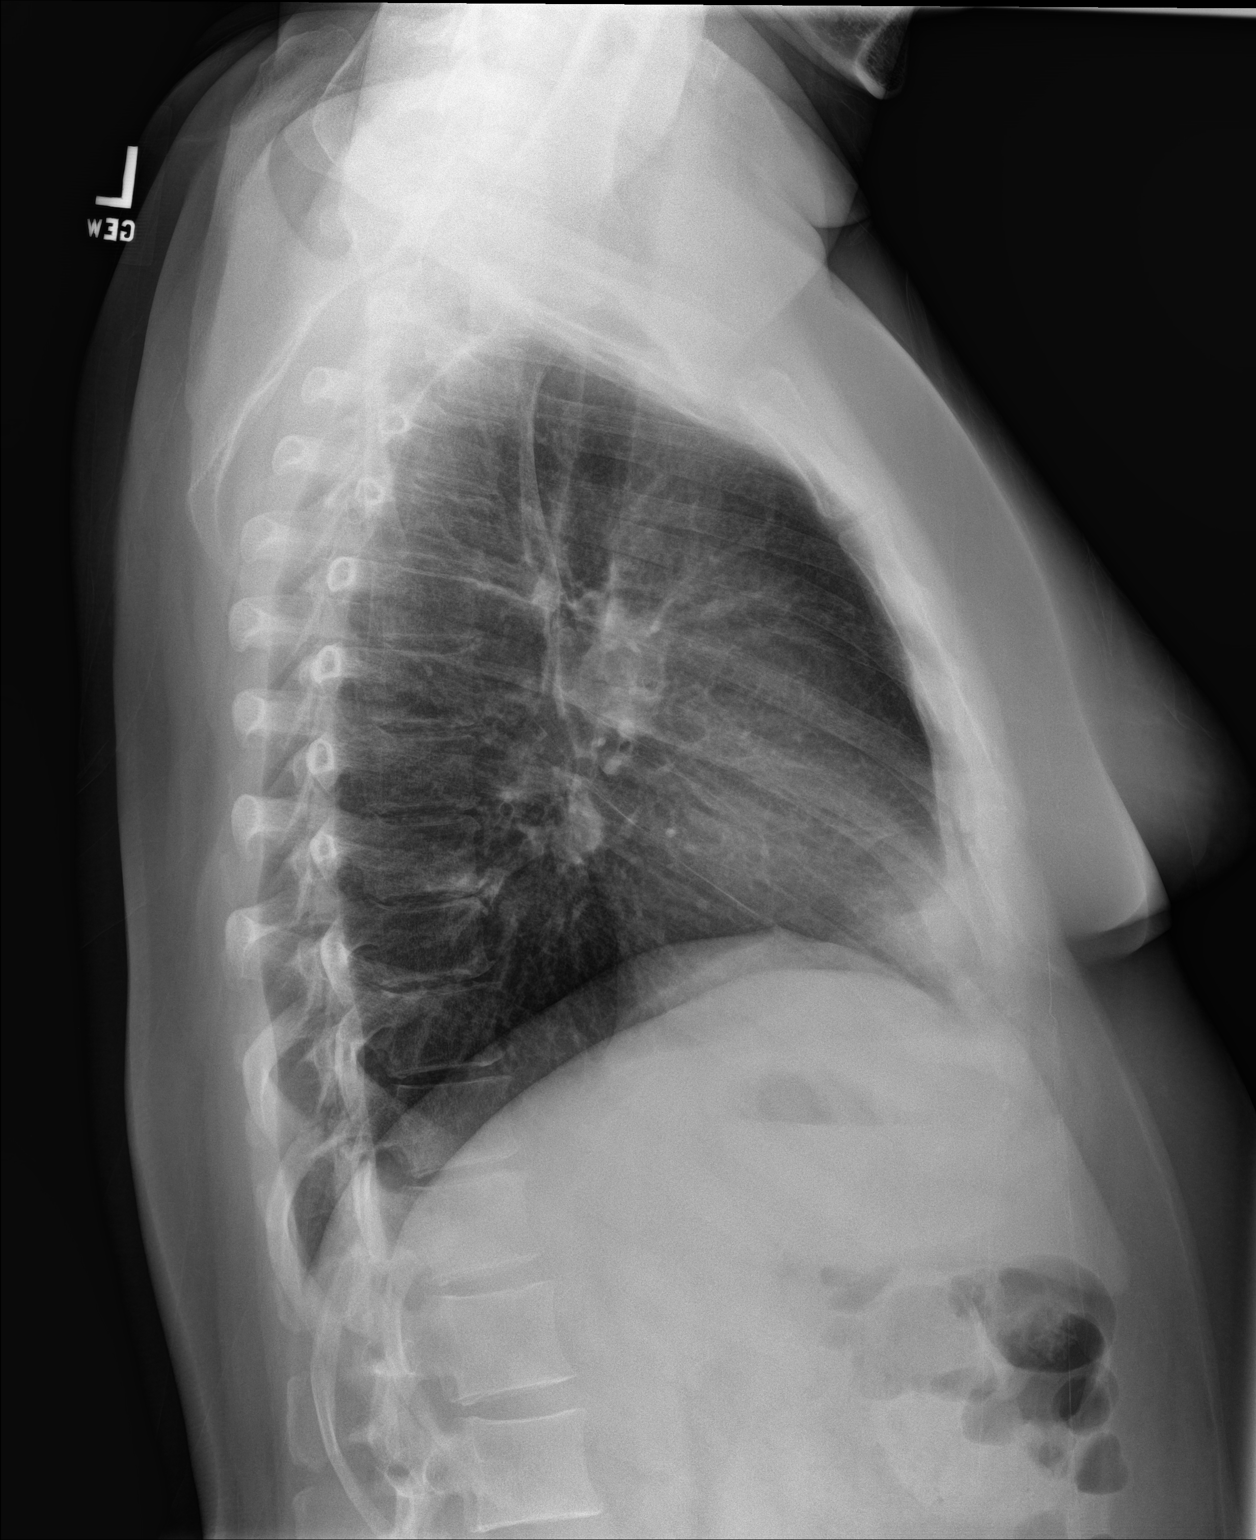

[2 of 2 positions shown; findings below may reference images not displayed]

FINDINGS: The heart size and mediastinal contours are within normal limits.
Both lungs are clear. The visualized skeletal structures are
unremarkable.
IMPRESSION: No active cardiopulmonary disease.

## 2017-04-28 IMAGING — US US ABDOMEN COMPLETE
1 series · 13 of 25 positions shown · non-contrast
Comparison: Abdominal ultrasound May 06, 2012.

CLINICAL DATA: Epigastric pain, abnormal screening aortic
examination in the position. This; history of hyperlipidemia and
smoking.

EXAM:
ULTRASOUND ABDOMEN COMPLETE

[Series 1: us abdomen complete · 0.30mm/px · 13 of 80 slices shown]
[im 1/80]
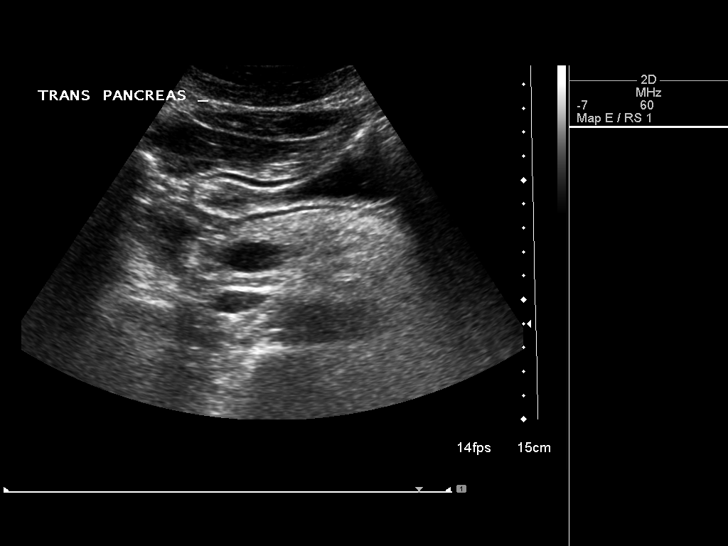
[im 7/80]
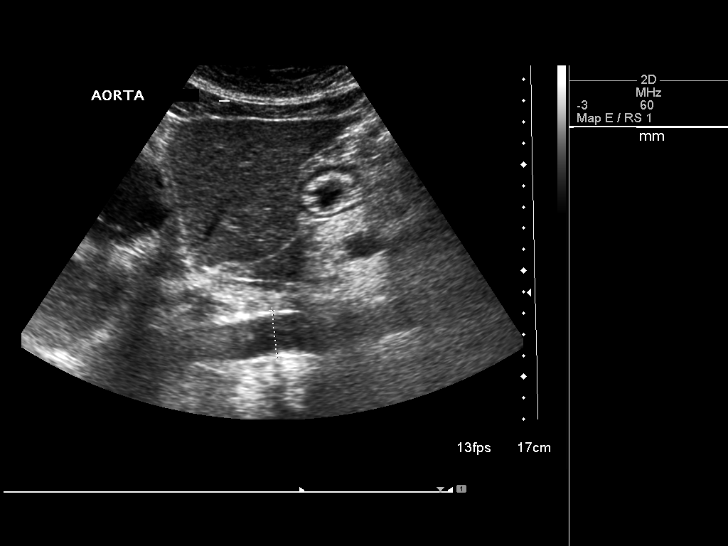
[im 14/80]
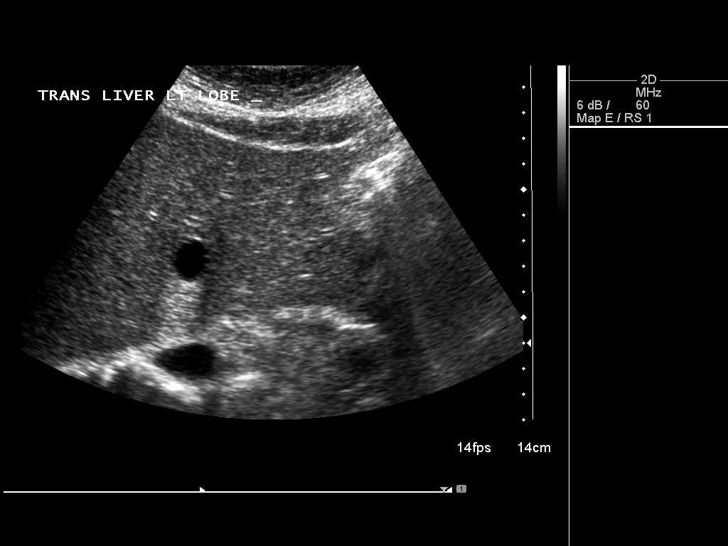
[im 20/80]
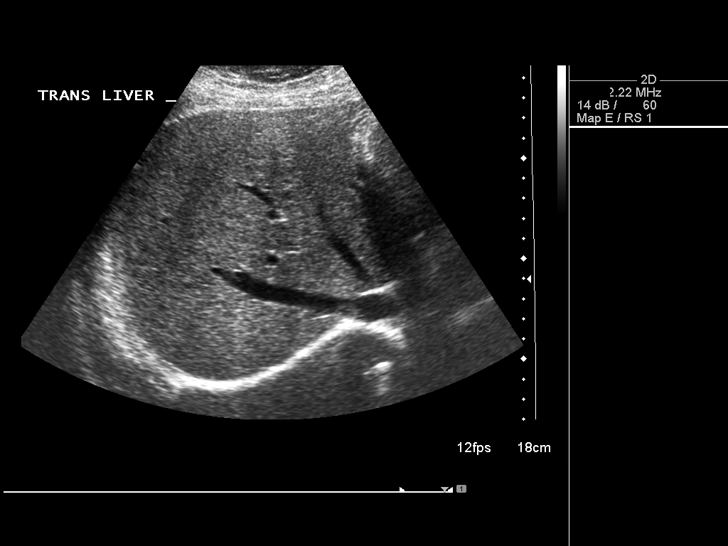
[im 27/80]
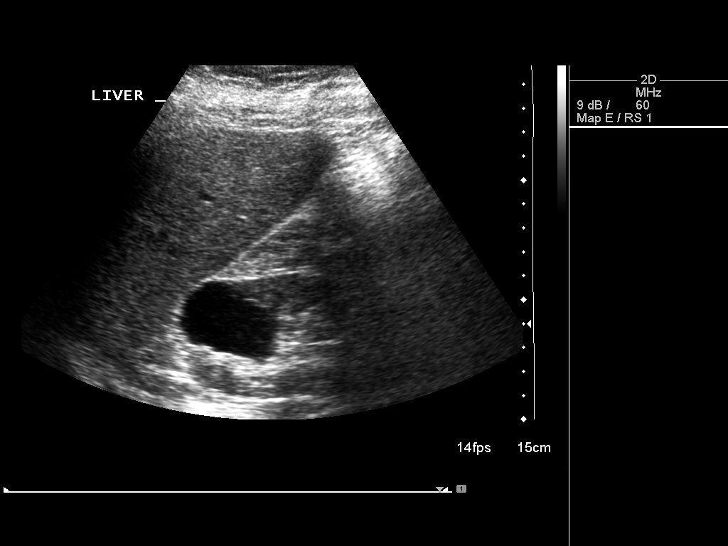
[im 33/80]
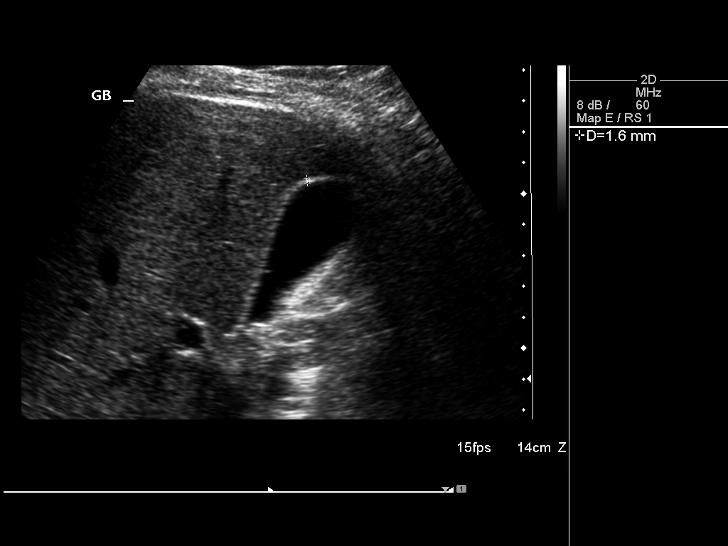
[im 40/80]
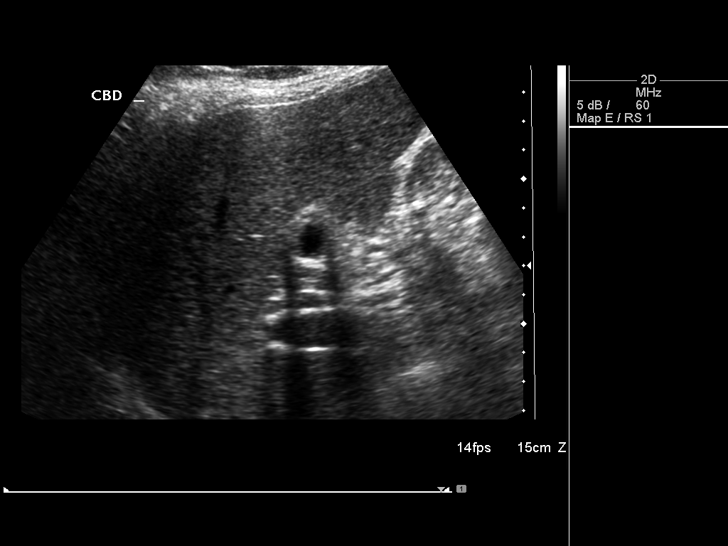
[im 47/80]
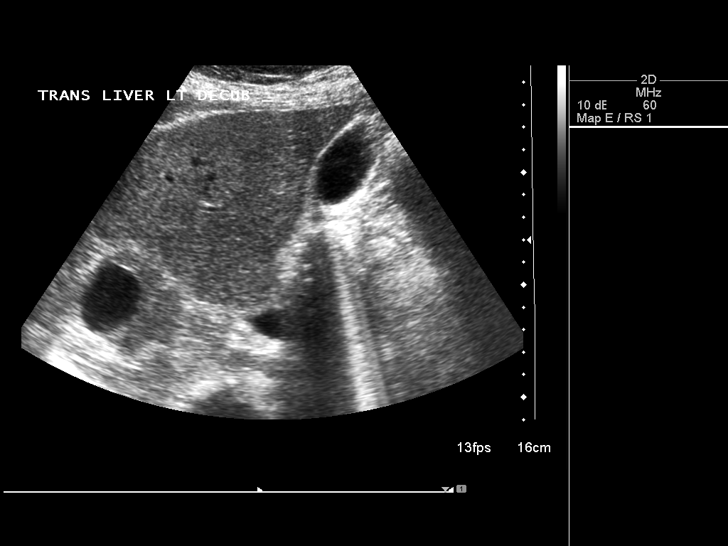
[im 53/80]
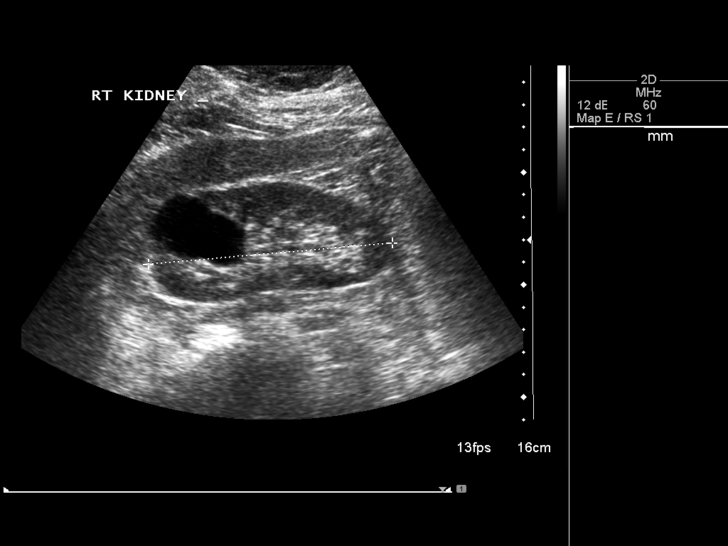
[im 60/80]
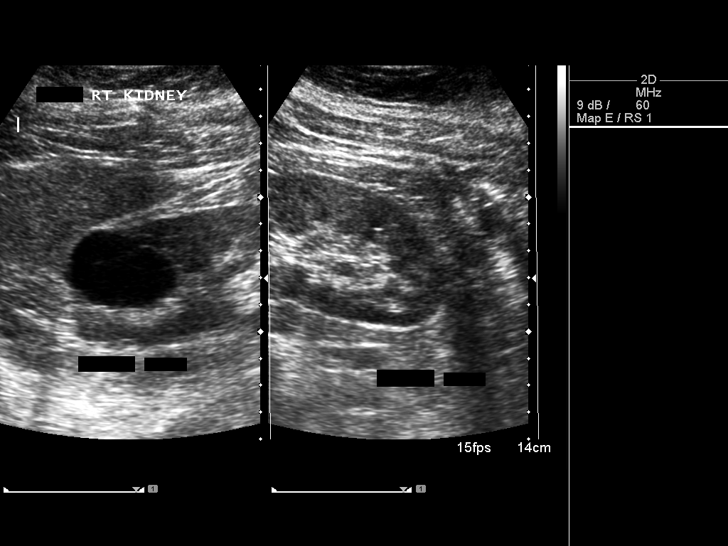
[im 66/80]
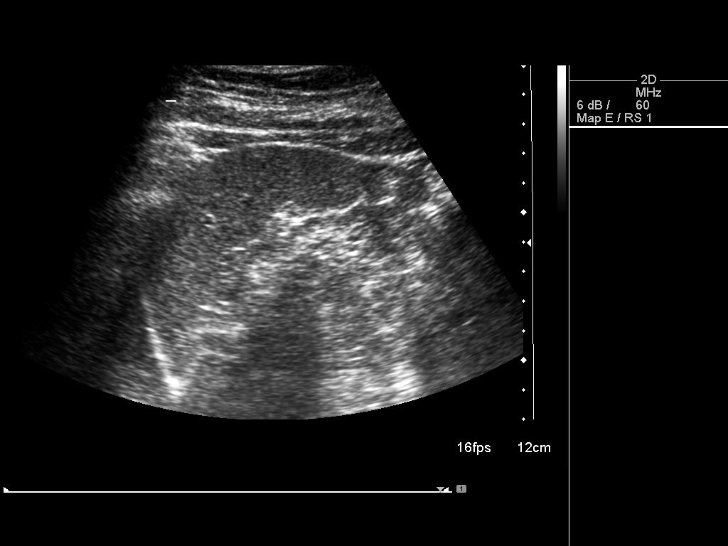
[im 73/80]
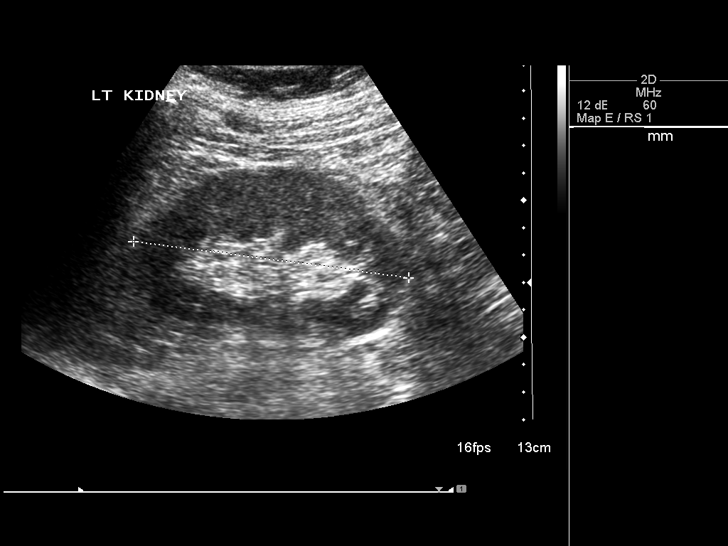
[im 80/80]
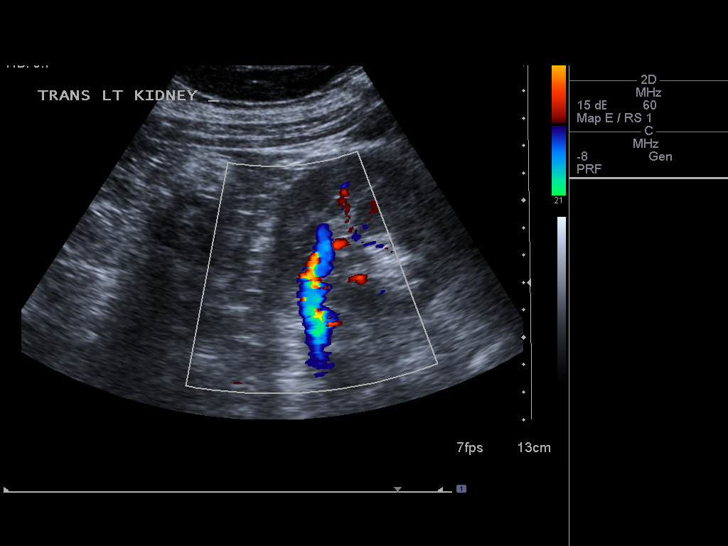

[13 of 25 positions shown; findings below may reference images not displayed]

FINDINGS: Gallbladder: The gallbladder is adequately distended. There are 2
non mobile echogenic foci measuring up to 2 mm most compatible with
polyps or non-mobile, non-shadowing stones. There is no gallbladder
wall thickening, pericholecystic fluid, or positive sonographic
Murphy's sign.

Common bile duct: Diameter: 5.2 mm

Liver: There is a simple appearing cyst in the left hepatic lobe
measuring 1.8 x 1.6 x 1.6 cm. This is slightly larger than on the
previous study. The hepatic echotexture is otherwise normal. There
is no intrahepatic ductal dilation.

IVC: No abnormality visualized.

Pancreas: Bowel gas obscured the pancreatic head. The pancreatic
body and tail are unremarkable.

Spleen: Size and appearance within normal limits.

Right Kidney: Length: 10.9 cm.. The renal cortical echotexture is
normal. There is a mid-upper pole cyst measuring 4.4 x 2.4 x 3.7 cm.
There is no hydronephrosis.

Left Kidney: Length: 10.1 cm. Echogenicity within normal limits. No
mass or hydronephrosis visualized.

Abdominal aorta: The abdominal aorta exhibits a normal tapering
caliber. It measures 2.3 cm proximally, 1.9 cm in its midportion,
and 1.8 cm distally. The common iliac arteries measure less than 1
cm in diameter.

Other findings: None.
IMPRESSION: 1. Two gallbladder polyps versus non shadowing non mobile stones
measuring up to 2 mm in diameter. There is no sonographic evidence
of acute cholecystitis. These findings have been previously
demonstrated.
2. Simple appearing cyst in the left hepatic lobe slightly larger
than on the previous study with maximal diameter today of 1.8 cm.
3. Mid upper pole right renal cyst measuring up to 4.4 cm in
diameter which has increased from approximately 3 cm on the previous
study. The right kidney is otherwise normal as is the left kidney.
4. Normal caliber of the abdominal aorta and common iliac vessels.

## 2017-07-29 ENCOUNTER — Encounter: Payer: Self-pay | Admitting: Gastroenterology

## 2017-08-14 DIAGNOSIS — Z1231 Encounter for screening mammogram for malignant neoplasm of breast: Secondary | ICD-10-CM | POA: Diagnosis not present

## 2017-08-14 DIAGNOSIS — Z124 Encounter for screening for malignant neoplasm of cervix: Secondary | ICD-10-CM | POA: Diagnosis not present

## 2017-08-14 DIAGNOSIS — Z01419 Encounter for gynecological examination (general) (routine) without abnormal findings: Secondary | ICD-10-CM | POA: Diagnosis not present

## 2017-08-19 ENCOUNTER — Encounter: Payer: Self-pay | Admitting: Physician Assistant

## 2017-08-19 ENCOUNTER — Ambulatory Visit: Payer: Self-pay | Admitting: Physician Assistant

## 2017-08-19 ENCOUNTER — Ambulatory Visit: Payer: 59 | Admitting: Physician Assistant

## 2017-08-19 VITALS — BP 126/80 | HR 81 | Temp 97.5°F | Resp 16 | Ht 60.5 in | Wt 159.2 lb

## 2017-08-19 DIAGNOSIS — L989 Disorder of the skin and subcutaneous tissue, unspecified: Secondary | ICD-10-CM

## 2017-08-19 NOTE — Progress Notes (Signed)
   Subjective:    Patient ID: Laura Kelley, female    DOB: [birth year], 51 y.o.   MRN: [id number]  HPI No charge Just checked moles which all looked normal.   Medications Current Outpatient Medications on File Prior to Visit  Medication Sig  . ALPRAZolam (XANAX) 1 MG tablet Take 1 tablet (1 mg total) by mouth at bedtime as needed for anxiety.   No current facility-administered medications on file prior to visit.     Problem list She has DIARRHEA; Family history of colonic polyps; Duodenal ulcer; Hyperlipidemia; GERD (gastroesophageal reflux disease); Vitamin D deficiency; Tobacco use disorder; Other abnormal glucose; and Obesity on their problem list.  Review of Systems     Objective:   Physical Exam        Assessment & Plan:  Skin check normal No future appointments.

## 2017-09-09 ENCOUNTER — Encounter: Payer: Self-pay | Admitting: Physician Assistant

## 2017-10-08 ENCOUNTER — Encounter: Payer: Self-pay | Admitting: Physician Assistant

## 2017-11-05 ENCOUNTER — Encounter: Payer: Self-pay | Admitting: Physician Assistant

## 2017-11-05 NOTE — Progress Notes (Deleted)
Complete Physical  Assessment and Plan:  Hyperlipidemia Check labs, diet discussed - CBC with Differential/Platelet - BASIC METABOLIC PANEL WITH GFR - Hepatic function panel - TSH - Lipid panel  Vitamin D deficiency - VITAMIN D 25 Hydroxy (Vit-D Deficiency, Fractures)  Gastroesophageal reflux disease with esophagitis PPI occ, diet discussed, H2 otherwise  Tobacco use disorder Smoking cessation-  instruction/counseling given, counseled patient on the dangers of tobacco use, advised patient to stop smoking, and reviewed strategies to maximize success, patient not ready to quit at this time.  - Urinalysis, Routine w reflex microscopic (not at [facility]) - Microalbumin / creatinine urine ratio  Other abnormal glucose  long discussion about diet, and exercise - TSH - Hemoglobin A1c - Insulin, fasting - Urinalysis, Routine w reflex microscopic (not at [facility]) - Microalbumin / creatinine urine ratio  Diarrhea, unspecified type Get on benefiber, diet discussed Get colonoscopy  Family history of colonic polyps Call Dr. Fuller Plan to see if 3 or 5 years due for colonoscopy   Encounter for general adult medical examination with abnormal findings  Medication management - Magnesium  Obesity - long discussion about weight loss, diet, and exercise  Discussed med's effects and SE's. Screening labs and tests as requested with regular follow-up as recommended. Over 40 minutes of exam, counseling, chart review, and complex, high level critical decision making was performed this visit.   HPI  52 y.o. W female  presents for a complete physical.  Her blood pressure has been controlled at home, today their BP is    Mom passed in may, job was stressful so she took severance and will enjoy a few weeks off until she starts to look for new job.   She does not workout but works out in her yard. She denies chest pain, shortness of breath, dizziness.  She is not on cholesterol medication and denies  myalgias. Her cholesterol is at goal. Last checked  Lab Results  Component Value Date   CHOL 237 (H) 09/03/2016   HDL 54 09/03/2016   LDLCALC 161 (H) 09/03/2016   TRIG 110 09/03/2016   CHOLHDL 4.4 09/03/2016    She has been working on diet and exercise for prediabetes, and denies paresthesia of the feet, polydipsia, polyuria and visual disturbances.  Lab Results  Component Value Date   HGBA1C 5.3 09/03/2016   Patient is not on Vitamin D supplement.   Occ leg cramping at night BMI is There is no height or weight on file to calculate BMI., she is working on diet and exercise. Wt Readings from Last 3 Encounters:  08/19/17 159 lb 3.2 oz (72.2 kg)  09/03/16 168 lb (76.2 kg)  06/10/16 165 lb 3.2 oz (74.9 kg)    Current Medications:  Current Outpatient Medications on File Prior to Visit  Medication Sig Dispense Refill  . ALPRAZolam (XANAX) 1 MG tablet Take 1 tablet (1 mg total) by mouth at bedtime as needed for anxiety. 30 tablet 1   No current facility-administered medications on file prior to visit.    Health Maintenance:   Immunization History  Administered Date(s) Administered  . Tdap 07/29/2011   Tetanus: 2012 Pneumovax: N/A Prevnar 13:  N/A Flu vaccine: declines Zostavax: N/A  LMP: 6 months ago Pap: 06/2016 MGM: Sees Dr. Philis Pique, 10/2015 DEXA: N/A Colonoscopy: 2013 due 2016 or 2018, will call Dr. Fuller Plan EGD: N/A MRI brain: 2011 CXr 2009  Last Dental Exam: yes, q 6 months Last Eye Exam: Chares burns, glasses, went to eye mart once time Patient  Care Team: Unk Pinto, MD as PCP - General (Internal Medicine) Ladene Artist, MD as Consulting Physician (Gastroenterology) Bobbye Charleston, MD as Consulting Physician (Obstetrics and Gynecology)  Medical History:  Past Medical History:  Diagnosis Date  . GERD (gastroesophageal reflux disease)    occ s/s  . History of duodenal ulcer 06/03/2012   + Hpylori treated 2013   . Hyperlipidemia   . Ulcer   .  Vitamin D deficiency    Allergies Allergies  Allergen Reactions  . Other     Stitches used after growth on back removed, caused redness and itching    SURGICAL HISTORY She  has a past surgical history that includes Lumbar disc surgery; Cesarean section (1985, 1993); and Esophagogastroduodenoscopy (06/03/2012). FAMILY HISTORY Her family history includes Colon polyps in her mother; Hemochromatosis in her brother; Multiple sclerosis in her mother. SOCIAL HISTORY She  reports that she has been smoking cigarettes.  She has a 8.50 pack-year smoking history. she has never used smokeless tobacco. She reports that she drinks alcohol. She reports that she does not use drugs.   Review of Systems: Review of Systems  Constitutional: Negative.   HENT: Negative.   Eyes: Negative.   Respiratory: Negative.  Negative for cough and shortness of breath.   Cardiovascular: Negative.  Negative for chest pain.  Gastrointestinal: Negative for abdominal pain, blood in stool, constipation, diarrhea, heartburn, melena, nausea and vomiting.  Genitourinary: Negative.   Musculoskeletal: Positive for myalgias (legs at night occ). Negative for back pain, falls, joint pain and neck pain.  Skin: Negative.   Neurological: Negative.   Psychiatric/Behavioral: Negative.  Negative for depression.    Physical Exam: Estimated body mass index is 30.58 kg/m as calculated from the following:   Height as of 08/19/17: 5' 0.5" (1.537 m).   Weight as of 08/19/17: 159 lb 3.2 oz (72.2 kg). LMP 07/24/2014  General Appearance: Well nourished, in no apparent distress.  Eyes: PERRLA, EOMs, conjunctiva no swelling or erythema, normal fundi and vessels.  Sinuses: No Frontal/maxillary tenderness  ENT/Mouth: Ext aud canals clear, normal light reflex with TMs without erythema, bulging. Good dentition. No erythema, swelling, or exudate on post pharynx. Tonsils not swollen or erythematous. Hearing normal.  Neck: Supple, thyroid normal. No  bruits  Respiratory: Respiratory effort normal, BS equal bilaterally without rales, rhonchi, wheezing or stridor.  Cardio: RRR without murmurs, rubs or gallops. Brisk peripheral pulses without edema.  Chest: symmetric, with normal excursions and percussion.  Breasts: defer GYN Abdomen: Soft, nontender, no guarding, rebound, hernias, masses, or organomegaly.  Lymphatics: Non tender without lymphadenopathy.  Genitourinary: defer GYN Musculoskeletal: Full ROM all peripheral extremities,5/5 strength, and normal gait.  Skin: Warm, dry without rashes, lesions, ecchymosis. Neuro: Cranial nerves intact, reflexes equal bilaterally. Normal muscle tone, no cerebellar symptoms. Sensation intact.  Psych: Awake and oriented X 3, normal affect, Insight and Judgment appropriate.   EKG: declines will get next year with CXR AORTA SCAN:  N/A  Vicie Mutters 2:51 PM [facility]

## 2017-11-07 ENCOUNTER — Encounter: Payer: Self-pay | Admitting: Physician Assistant

## 2017-12-17 NOTE — Progress Notes (Signed)
Complete Physical  Assessment and Plan:  Hyperlipidemia Check labs, diet discussed - CBC with Differential/Platelet - BASIC METABOLIC PANEL WITH GFR - Hepatic function panel - TSH - Lipid panel  Vitamin D deficiency - VITAMIN D 25 Hydroxy (Vit-D Deficiency, Fractures)  Gastroesophageal reflux disease with esophagitis PPI occ, diet discussed, H2 otherwise  Tobacco use disorder Smoking cessation-  instruction/counseling given, counseled patient on the dangers of tobacco use, advised patient to stop smoking, and reviewed strategies to maximize success, patient not ready to quit at this time.  Discussed wellbutrin- will think about it Will get CXR next year  Duodenal ulcer Treated Hpylori, nexium, occ, avoid alcohol and NSAIDS  Diarrhea, unspecified type Get on benefiber, diet discussed Get colonoscopy  Family history of colonic polyps Call Dr. Fuller Plan to see if 3 or 5 years due for colonoscopy   Encounter for general adult medical examination with abnormal findings Up to date, declines flu vaccine, call Dr. Fuller Plan  Medication management - Magnesium  Obesity - long discussion about weight loss, diet, and exercise  Discussed med's effects and SE's. Screening labs and tests as requested with regular follow-up as recommended. Over 40 minutes of exam, counseling, chart review, and complex, high level critical decision making was performed this visit.   HPI  52 y.o. W female  presents for a complete physical.  Her blood pressure has been controlled at home, today their BP is BP: 106/80  She has new job with [employer], loves the job, works from home, doing well. So she VERY rarely takes the xanax.  She continues to smoke. She does not workout but works out in her yard. She denies chest pain, shortness of breath, dizziness.  She is not on cholesterol medication and denies myalgias. Her cholesterol is at goal. Last checked  Lab Results  Component Value Date   CHOL 237 (H)  09/03/2016   HDL 54 09/03/2016   LDLCALC 161 (H) 09/03/2016   TRIG 110 09/03/2016   CHOLHDL 4.4 09/03/2016    She has been working on diet and exercise for prediabetes, and denies paresthesia of the feet, polydipsia, polyuria and visual disturbances.  Lab Results  Component Value Date   HGBA1C 5.3 09/03/2016   Patient is not on Vitamin D supplement.   Occ leg cramping at night BMI is Body mass index is 31.14 kg/m., she is working on diet and exercise. Wt Readings from Last 3 Encounters:  12/19/17 164 lb 12.8 oz (74.8 kg)  08/19/17 159 lb 3.2 oz (72.2 kg)  09/03/16 168 lb (76.2 kg)    Current Medications:  Current Outpatient Medications on File Prior to Visit  Medication Sig Dispense Refill  . ALPRAZolam (XANAX) 1 MG tablet Take 1 tablet (1 mg total) by mouth at bedtime as needed for anxiety. 30 tablet 1   No current facility-administered medications on file prior to visit.    Health Maintenance:   Immunization History  Administered Date(s) Administered  . Tdap 07/29/2011   Tetanus: 2012 Pneumovax: N/A Prevnar 13:  N/A Flu vaccine: declines Zostavax: N/A  LMP: years ago Pap: 06/2016 MGM:  Dr. Philis Pique, 09/2017 DEXA: N/A Colonoscopy: 2013 due  will call Dr. Fuller Plan EGD: N/A MRI brain: 2011 CXr 08/2015  Last Dental Exam: yes, q 6 months Last Eye Exam: Chares burns, glasses, went to eye mart once time Patient Care Team: Unk Pinto, MD as PCP - General (Internal Medicine) Ladene Artist, MD as Consulting Physician (Gastroenterology) Bobbye Charleston, MD as Consulting Physician (Obstetrics and Gynecology)  Medical History:  Past Medical History:  Diagnosis Date  . GERD (gastroesophageal reflux disease)    occ s/s  . History of duodenal ulcer 06/03/2012   + Hpylori treated 2013   . Hyperlipidemia   . Ulcer   . Vitamin D deficiency    Allergies Allergies  Allergen Reactions  . Other     Stitches used after growth on back removed, caused redness and  itching    SURGICAL HISTORY She  has a past surgical history that includes Lumbar disc surgery; Cesarean section (1985, 1993); and Esophagogastroduodenoscopy (06/03/2012). FAMILY HISTORY Her family history includes Colon polyps in her mother; Hemochromatosis in her brother; Multiple sclerosis in her mother. SOCIAL HISTORY She  reports that she has been smoking cigarettes.  She has a 8.50 pack-year smoking history. she has never used smokeless tobacco. She reports that she drinks alcohol. She reports that she does not use drugs.   Review of Systems: Review of Systems  Constitutional: Negative.   HENT: Negative.   Eyes: Negative.   Respiratory: Negative.  Negative for cough and shortness of breath.   Cardiovascular: Negative.  Negative for chest pain.  Gastrointestinal: Negative for abdominal pain, blood in stool, constipation, diarrhea, heartburn, melena, nausea and vomiting.  Genitourinary: Negative.   Musculoskeletal: Positive for myalgias (legs at night occ). Negative for back pain, falls, joint pain and neck pain.  Skin: Negative.   Neurological: Negative.   Psychiatric/Behavioral: Negative.  Negative for depression.    Physical Exam: Estimated body mass index is 31.14 kg/m as calculated from the following:   Height as of this encounter: 5\' 1"  (1.549 m).   Weight as of this encounter: 164 lb 12.8 oz (74.8 kg). BP 106/80   Pulse 80   Temp (!) 97.5 F (36.4 C)   Resp 16   Ht 5\' 1"  (1.549 m)   Wt 164 lb 12.8 oz (74.8 kg)   LMP 07/24/2014   BMI 31.14 kg/m  General Appearance: Well nourished, in no apparent distress.  Eyes: PERRLA, EOMs, conjunctiva no swelling or erythema, normal fundi and vessels.  Sinuses: No Frontal/maxillary tenderness  ENT/Mouth: Ext aud canals clear, normal light reflex with TMs without erythema, bulging. Good dentition. No erythema, swelling, or exudate on post pharynx. Tonsils not swollen or erythematous. Hearing normal.  Neck: Supple, thyroid  normal. No bruits  Respiratory: Respiratory effort normal, BS equal bilaterally without rales, rhonchi, wheezing or stridor.  Cardio: RRR without murmurs, rubs or gallops. Brisk peripheral pulses without edema.  Chest: symmetric, with normal excursions and percussion.  Breasts: defer GYN Abdomen: Soft, nontender, no guarding, rebound, hernias, masses, or organomegaly.  Lymphatics: Non tender without lymphadenopathy.  Genitourinary: defer GYN Musculoskeletal: Full ROM all peripheral extremities,5/5 strength, and normal gait.  Skin: Warm, dry without rashes, lesions, ecchymosis. Neuro: Cranial nerves intact, reflexes equal bilaterally. Normal muscle tone, no cerebellar symptoms. Sensation intact.  Psych: Awake and oriented X 3, normal affect, Insight and Judgment appropriate.   EKG: declines will get next year with CXR AORTA SCAN:  N/A  Laura Kelley 11:44 AM [facility]

## 2017-12-19 ENCOUNTER — Ambulatory Visit (INDEPENDENT_AMBULATORY_CARE_PROVIDER_SITE_OTHER): Payer: 59 | Admitting: Physician Assistant

## 2017-12-19 ENCOUNTER — Encounter: Payer: Self-pay | Admitting: Physician Assistant

## 2017-12-19 VITALS — BP 106/80 | HR 80 | Temp 97.5°F | Resp 16 | Ht 61.0 in | Wt 164.8 lb

## 2017-12-19 DIAGNOSIS — E785 Hyperlipidemia, unspecified: Secondary | ICD-10-CM

## 2017-12-19 DIAGNOSIS — Z136 Encounter for screening for cardiovascular disorders: Secondary | ICD-10-CM

## 2017-12-19 DIAGNOSIS — Z79899 Other long term (current) drug therapy: Secondary | ICD-10-CM | POA: Diagnosis not present

## 2017-12-19 DIAGNOSIS — F17200 Nicotine dependence, unspecified, uncomplicated: Secondary | ICD-10-CM

## 2017-12-19 DIAGNOSIS — K2100 Gastro-esophageal reflux disease with esophagitis, without bleeding: Secondary | ICD-10-CM

## 2017-12-19 DIAGNOSIS — I10 Essential (primary) hypertension: Secondary | ICD-10-CM

## 2017-12-19 DIAGNOSIS — R7309 Other abnormal glucose: Secondary | ICD-10-CM

## 2017-12-19 DIAGNOSIS — Z8371 Family history of colonic polyps: Secondary | ICD-10-CM

## 2017-12-19 DIAGNOSIS — Z0000 Encounter for general adult medical examination without abnormal findings: Secondary | ICD-10-CM | POA: Diagnosis not present

## 2017-12-19 DIAGNOSIS — E559 Vitamin D deficiency, unspecified: Secondary | ICD-10-CM | POA: Diagnosis not present

## 2017-12-19 DIAGNOSIS — Z83719 Family history of colon polyps, unspecified: Secondary | ICD-10-CM

## 2017-12-19 MED ORDER — ALPRAZOLAM 1 MG PO TABS: 1.0000 mg | ORAL_TABLET | Freq: Every evening | ORAL | 0 refills | Status: DC | PRN

## 2017-12-19 NOTE — Patient Instructions (Addendum)
Call Dr. Fuller Plan for colonoscopy Phone: [phone]   ADVANTAGES OF QUITTING SMOKING  Within 20 minutes, blood pressure decreases. Your pulse is at normal level.  After 8 hours, carbon monoxide levels in the blood return to normal. Your oxygen level increases.  After 24 hours, the chance of having a heart attack starts to decrease. Your breath, hair, and body stop smelling like smoke.  After 48 hours, damaged nerve endings begin to recover. Your sense of taste and smell improve.  After 72 hours, the body is virtually free of nicotine. Your bronchial tubes relax and breathing becomes easier.  After 2 to 12 weeks, lungs can hold more air. Exercise becomes easier and circulation improves.  After 1 year, the risk of coronary heart disease is cut in half.  After 5 years, the risk of stroke falls to the same as a nonsmoker.  After 10 years, the risk of lung cancer is cut in half and the risk of other cancers decreases significantly.  After 15 years, the risk of coronary heart disease drops, usually to the level of a nonsmoker.  You will have extra money to spend on things other than cigarettes.   Intermittent fasting is more about strategy than starvation. It's meant to reset your body in different ways, hopefully with fitness and nutrition changes as a result.  Like any big switchover, though, results may vary when it comes down to the individual level. What works for your friends may not work for you, or vice versa. That's why it's helpful to play around with variations on intermittent fasting and healthy habits and find what works best for you.  WHAT IS INTERMITTENT FASTING AND WHY DO IT?  Intermittent fasting doesn't involve specific foods, but rather, a strict schedule regarding when you eat. Also called "time-restricted eating," the tactic has been praised for its contribution to weight loss, improved body composition, and decreased cravings. Preliminary research also suggests it  may be beneficial for glucose tolerance, hormone regulation, better muscle mass and lower body fat.  Part of its appeal is the simplicity of the effort. Unlike some other trends, there's no calculations to intermittent fasting.  You simply eat within a certain block of time, usually a window of 8-10 hours. In the other big block of time - about 14-16 hours, including when you're asleep - you don't eat anything, not even snacks. You can drink water, coffee, tea or any other beverage that doesn't have calories.  For example, if you like having a late dinner, you might skip breakfast and have your first meal at noon and your last meal of the day at 8 p.m., and then not eat until noon again the next day.  IDEAS FOR GETTING STARTED  If you're new to the strategy, it may be helpful to eat within the typical circadian rhythm and keep eating within daylight hours. This can be especially beneficial if you're looking at intermittent fasting for weight-loss goals.  So first try only eating between 12pm to 8pm.  Outside of this time you may have water, black coffee, and hot tea. You may not eat it drink anything that has carbs, sugars, OR artificial sugars like diet soda.   Like any major eating and fitness shift, it can take time to find the perfect fit, so don't be afraid to experiment with different options - including ditching intermittent fasting altogether if it's simply not for you. But if it is, you may be surprised by some of the benefits that come  along with the strategy.  Are you an emotional eater? Do you eat more when you're feeling stressed? Do you eat when you're not hungry or when you're full? Do you eat to feel better (to calm and soothe yourself when you're sad, mad, bored, anxious, etc.)? Do you reward yourself with food? Do you regularly eat until you've stuffed yourself? Does food make you feel safe? Do you feel like food is a friend? Do you feel powerless or out of control around  food?  If you answered yes to some of these questions than it is likely that you are an emotional eater. This is normally a learned behavior and can take time to first recognize the signs and second BREAK THE HABIT. But here is more information and tips to help.   The difference between emotional hunger and physical hunger Emotional hunger can be powerful, so it's easy to mistake it for physical hunger. But there are clues you can look for to help you tell physical and emotional hunger apart.  Emotional hunger comes on suddenly. It hits you in an instant and feels overwhelming and urgent. Physical hunger, on the other hand, comes on more gradually. The urge to eat doesn't feel as dire or demand instant satisfaction (unless you haven't eaten for a very long time).  Emotional hunger craves specific comfort foods. When you're physically hungry, almost anything sounds good-including healthy stuff like vegetables. But emotional hunger craves junk food or sugary snacks that provide an instant rush. You feel like you need cheesecake or pizza, and nothing else will do.  Emotional hunger often leads to mindless eating. Before you know it, you've eaten a whole bag of chips or an entire pint of ice cream without really paying attention or fully enjoying it. When you're eating in response to physical hunger, you're typically more aware of what you're doing.  Emotional hunger isn't satisfied once you're full. You keep wanting more and more, often eating until you're uncomfortably stuffed. Physical hunger, on the other hand, doesn't need to be stuffed. You feel satisfied when your stomach is full.  Emotional hunger isn't located in the stomach. Rather than a growling belly or a pang in your stomach, you feel your hunger as a craving you can't get out of your head. You're focused on specific textures, tastes, and smells.  Emotional hunger often leads to regret, guilt, or shame. When you eat to satisfy physical  hunger, you're unlikely to feel guilty or ashamed because you're simply giving your body what it needs. If you feel guilty after you eat, it's likely because you know deep down that you're not eating for nutritional reasons.  Identify your emotional eating triggers What situations, places, or feelings make you reach for the comfort of food? Most emotional eating is linked to unpleasant feelings, but it can also be triggered by positive emotions, such as rewarding yourself for achieving a goal or celebrating a holiday or happy event. Common causes of emotional eating include:  Stuffing emotions - Eating can be a way to temporarily silence or "stuff down" uncomfortable emotions, including anger, fear, sadness, anxiety, loneliness, resentment, and shame. While you're numbing yourself with food, you can avoid the difficult emotions you'd rather not feel.  Boredom or feelings of emptiness - Do you ever eat simply to give yourself something to do, to relieve boredom, or as a way to fill a void in your life? You feel unfulfilled and empty, and food is a way to occupy your mouth  and your time. In the moment, it fills you up and distracts you from underlying feelings of purposelessness and dissatisfaction with your life.  Childhood habits - Think back to your childhood memories of food. Did your parents reward good behavior with ice cream, take you out for pizza when you got a good report card, or serve you sweets when you were feeling sad? These habits can often carry over into adulthood. Or your eating may be driven by nostalgia-for cherished memories of grilling burgers in the backyard with your dad or baking and eating cookies with your mom.  Social influences - Getting together with other people for a meal is a great way to relieve stress, but it can also lead to overeating. It's easy to overindulge simply because the food is there or because everyone else is eating. You may also overeat in social situations  out of nervousness. Or perhaps your family or circle of friends encourages you to overeat, and it's easier to go along with the group.  Stress - Ever notice how stress makes you hungry? It's not just in your mind. When stress is chronic, as it so often is in our chaotic, fast-paced world, your body produces high levels of the stress hormone, cortisol. Cortisol triggers cravings for salty, sweet, and fried foods-foods that give you a burst of energy and pleasure. The more uncontrolled stress in your life, the more likely you are to turn to food for emotional relief.  Find other ways to feed your feelings If you don't know how to manage your emotions in a way that doesn't involve food, you won't be able to control your eating habits for very long. Diets so often fail because they offer logical nutritional advice which only works if you have conscious control over your eating habits. It doesn't work when emotions hijack the process, demanding an immediate payoff with food.  In order to stop emotional eating, you have to find other ways to fulfill yourself emotionally. It's not enough to understand the cycle of emotional eating or even to understand your triggers, although that's a huge first step. You need alternatives to food that you can turn to for emotional fulfillment.  Alternatives to emotional eating If you're depressed or lonely, call someone who always makes you feel better, play with your dog or cat, or look at a favorite photo or cherished memento.  If you're anxious, expend your nervous energy by dancing to your favorite song, squeezing a stress ball, or taking a brisk walk.  If you're exhausted, treat yourself with a hot cup of tea, take a bath, light some scented candles, or wrap yourself in a warm blanket.  If you're bored, read a good book, watch a comedy show, explore the outdoors, or turn to an activity you enjoy (woodworking, playing the guitar, shooting hoops, scrapbooking,  etc.).  What is mindful eating? Mindful eating is a practice that develops your awareness of eating habits and allows you to pause between your triggers and your actions. Most emotional eaters feel powerless over their food cravings. When the urge to eat hits, you feel an almost unbearable tension that demands to be fed, right now. Because you've tried to resist in the past and failed, you believe that your willpower just isn't up to snuff. But the truth is that you have more power over your cravings than you think.  Take 5 before you give in to a craving Emotional eating tends to be automatic and virtually mindless. Before you  even realize what you're doing, you've reached for a tub of ice cream and polished off half of it. But if you can take a moment to pause and reflect when you're hit with a craving, you give yourself the opportunity to make a different decision.  Can you put off eating for five minutes? Or just start with one minute. Don't tell yourself you can't give in to the craving; remember, the forbidden is extremely tempting. Just tell yourself to wait.  While you're waiting, check in with yourself. How are you feeling? What's going on emotionally? Even if you end up eating, you'll have a better understanding of why you did it. This can help you set yourself up for a different response next time.  How to practice mindful eating Eating while you're also doing other things-such as watching TV, driving, or playing with your phone-can prevent you from fully enjoying your food. Since your mind is elsewhere, you may not feel satisfied or continue eating even though you're no longer hungry. Eating more mindfully can help focus your mind on your food and the pleasure of a meal and curb overeating.   Eat your meals in a calm place with no distractions, aside from any dining companions.  Try eating with your non-dominant hand or using chopsticks instead of a knife and fork. Eating in such a  non-familiar way can slow down how fast you eat and ensure your mind stays focused on your food.  Allow yourself enough time not to have to rush your meal. Set a timer for 20 minutes and pace yourself so you spend at least that much time eating.  Take small bites and chew them well, taking time to notice the different flavors and textures of each mouthful.  Put your utensils down between bites. Take time to consider how you feel-hungry, satiated-before picking up your utensils again.  Try to stop eating before you are full.It takes time for the signal to reach your brain that you've had enough. Don't feel obligated to always clean your plate.  When you've finished your food, take a few moments to assess if you're really still hungry before opting for an extra serving or dessert.  Learn to accept your feelings-even the bad ones  While it may seem that the core problem is that you're powerless over food, emotional eating actually stems from feeling powerless over your emotions. You don't feel capable of dealing with your feelings head on, so you avoid them with food.  Recommended reading  Mini Habits for weight loss  Healthy Eating: A guide to the new nutrition - [place] Report  10 Tips for Mindful Eating - How mindfulness can help you fully enjoy a meal and the experience of eating-with moderation and restraint. ([place])  Weight Loss: Gain Control of Emotional Eating - Tips to regain control of your eating habits. [facility])  Why Stress Causes People to Overeat -Tips on controlling stress eating. ([place])  Mindful Eating Meditations -Free online mindfulness meditations. ([employer] [facility] [employer])       When it comes to diets, agreement about the perfect plan isn't easy to find, even among the experts. Experts at the [employer] developed an idea known as the Healthy Eating Plate. Just imagine  a plate divided into logical, healthy portions.  The emphasis is on diet quality:  Load up on vegetables and fruits - one-half of your plate: Aim for color and  variety, and remember that potatoes don't count.  Go for whole grains - one-quarter of your plate: Whole wheat, barley, wheat berries, quinoa, oats, brown rice, and foods made with them. If you want pasta, go with whole wheat pasta.  Protein power - one-quarter of your plate: Fish, chicken, beans, and nuts are all healthy, versatile protein sources. Limit red meat.  The diet, however, does go beyond the plate, offering a few other suggestions.  Use healthy plant oils, such as olive, canola, soy, corn, sunflower and peanut. Check the labels, and avoid partially hydrogenated oil, which have unhealthy trans fats.  If you're thirsty, drink water. Coffee and tea are good in moderation, but skip sugary drinks and limit milk and dairy products to one or two daily servings.  The type of carbohydrate in the diet is more important than the amount. Some sources of carbohydrates, such as vegetables, fruits, whole grains, and beans-are healthier than others.  Finally, stay active.  Veggies are great because you can eat a ton! They are low in calories, great to fill you up, and have a ton of vitamins, minerals, and protein.

## 2017-12-20 LAB — CBC WITH DIFFERENTIAL/PLATELET
Basophils Absolute: 53 cells/uL (ref 0–200)
Basophils Relative: 0.5 %
Eosinophils Absolute: 336 cells/uL (ref 15–500)
Eosinophils Relative: 3.2 %
HCT: 42.1 % (ref 35.0–45.0)
Hemoglobin: 14.8 g/dL (ref 11.7–15.5)
Lymphs Abs: 2951 cells/uL (ref 850–3900)
MCH: 31.9 pg (ref 27.0–33.0)
MCHC: 35.2 g/dL (ref 32.0–36.0)
MCV: 90.7 fL (ref 80.0–100.0)
MPV: 10.5 fL (ref 7.5–12.5)
Monocytes Relative: 7.7 %
Neutro Abs: 6353 cells/uL (ref 1500–7800)
Neutrophils Relative %: 60.5 %
Platelets: 369 10*3/uL (ref 140–400)
RBC: 4.64 10*6/uL (ref 3.80–5.10)
RDW: 12.6 % (ref 11.0–15.0)
Total Lymphocyte: 28.1 %
WBC mixed population: 809 cells/uL (ref 200–950)
WBC: 10.5 10*3/uL (ref 3.8–10.8)

## 2017-12-20 LAB — BASIC METABOLIC PANEL WITH GFR
BUN: 13 mg/dL (ref 7–25)
CO2: 27 mmol/L (ref 20–32)
Calcium: 10 mg/dL (ref 8.6–10.4)
Chloride: 106 mmol/L (ref 98–110)
Creat: 0.69 mg/dL (ref 0.50–1.05)
GFR, Est African American: 115 mL/min/{1.73_m2} (ref >=60)
GFR, Est Non African American: 99 mL/min/{1.73_m2} (ref >=60)
Glucose, Bld: 88 mg/dL (ref 65–99)
Potassium: 4.9 mmol/L (ref 3.5–5.3)
Sodium: 140 mmol/L (ref 135–146)

## 2017-12-20 LAB — HEPATIC FUNCTION PANEL
AG Ratio: 1.8 (calc) (ref 1.0–2.5)
ALT: 12 U/L (ref 6–29)
AST: 14 U/L (ref 10–35)
Albumin: 4.5 g/dL (ref 3.6–5.1)
Alkaline phosphatase (APISO): 79 U/L (ref 33–130)
Bilirubin, Direct: 0.1 mg/dL (ref 0.0–0.2)
Globulin: 2.5 g/dL (calc) (ref 1.9–3.7)
Indirect Bilirubin: 0.2 mg/dL (calc) (ref 0.2–1.2)
Total Bilirubin: 0.3 mg/dL (ref 0.2–1.2)
Total Protein: 7 g/dL (ref 6.1–8.1)

## 2017-12-20 LAB — VITAMIN D 25 HYDROXY (VIT D DEFICIENCY, FRACTURES): Vit D, 25-Hydroxy: 24 ng/mL — ABNORMAL LOW (ref 30–100)

## 2017-12-20 LAB — LIPID PANEL
Cholesterol: 224 mg/dL — ABNORMAL HIGH (ref <200)
HDL: 53 mg/dL (ref >50)
LDL Cholesterol (Calc): 152 mg/dL (calc) — ABNORMAL HIGH
Non-HDL Cholesterol (Calc): 171 mg/dL (calc) — ABNORMAL HIGH (ref <130)
Total CHOL/HDL Ratio: 4.2 (calc) (ref <5.0)
Triglycerides: 88 mg/dL (ref <150)

## 2017-12-20 LAB — MAGNESIUM: Magnesium: 2.1 mg/dL (ref 1.5–2.5)

## 2017-12-20 LAB — TSH: TSH: 1.88 mIU/L

## 2018-01-23 ENCOUNTER — Ambulatory Visit: Payer: Self-pay | Admitting: Physician Assistant

## 2018-11-19 NOTE — Progress Notes (Signed)
H&P 53 y.o. female presents to the office with left ingrown toe nail with  with pain, erythema. Has had for years. Denies fever, chills.   Medications: Current Outpatient Medications on File Prior to Visit  Medication Sig Dispense Refill  . ALPRAZolam (XANAX) 1 MG tablet Take 1 tablet (1 mg total) by mouth at bedtime as needed for anxiety. 30 tablet 0   No current facility-administered medications on file prior to visit.     Problem list Patient Active Problem List   Diagnosis Date Noted  . Tobacco use disorder 08/31/2015  . Other abnormal glucose 08/31/2015  . Obesity 08/31/2015  . Hyperlipidemia   . GERD (gastroesophageal reflux disease)   . Vitamin D deficiency   . Family history of colonic polyps 05/11/2012  . DIARRHEA 07/28/2009    Physical Exam Blood pressure 128/86, pulse 60, temperature 98 F (36.7 C), height 5\' 1"  (1.549 m), weight 163 lb 9.6 oz (74.2 kg), last menstrual period 07/24/2014, SpO2 97 %. cooperative, appears stated age and no distress nail exam ingrown nail at left great lateral toenail, onycholysis and dystrophic nails  PROCEDURE NOTE: Ingrown Toenail Removal  Verbal Consent Obtained. Left great toe wiped with alcohol prep pad, then digital block with 4cc of 1% lidocaine  left medial nail lifted and excised, medial aspect of nail left intact.  Proximal aspect of nail bed explored revealing no nail remnants. Ingrown tissue debrided. No active bleeding. Dressing applied. Cleansed and dressed.  Assessment and Plan: Ingrown toenail Wound care instructions including precautions reviewed with patient. DX: [id number] complete nail removal ingrown toenail          Future Appointments  Date Time Provider [place]  12/24/2018 10:00 AM Vicie Mutters, PA-C [id number] None

## 2018-11-20 ENCOUNTER — Encounter: Payer: Self-pay | Admitting: Physician Assistant

## 2018-11-20 ENCOUNTER — Ambulatory Visit (INDEPENDENT_AMBULATORY_CARE_PROVIDER_SITE_OTHER): Payer: 59 | Admitting: Physician Assistant

## 2018-11-20 VITALS — BP 128/86 | HR 60 | Temp 98.0°F | Ht 61.0 in | Wt 163.6 lb

## 2018-11-20 DIAGNOSIS — L600 Ingrowing nail: Secondary | ICD-10-CM | POA: Diagnosis not present

## 2018-11-20 MED ORDER — ALPRAZOLAM 1 MG PO TABS: 1.0000 mg | ORAL_TABLET | Freq: Every evening | ORAL | 0 refills | Status: DC | PRN

## 2018-11-20 MED ORDER — TERBINAFINE HCL 250 MG PO TABS: 250.0000 mg | ORAL_TABLET | Freq: Every day | ORAL | 0 refills | Status: DC

## 2018-11-20 NOTE — Patient Instructions (Addendum)
Okay I will send in lamisil for you to take. You can only get it from [place] or [employer] will not cover it.The lamisil is taken once a day for  months and then if can be taken for several months after for a month on it and month off it. It gets processed through you liver so we need to check your liver function at 6 weeks after taking the drug. It can take up to 6 months to a year for your toenails to get better.   Fingernail or Toenail Removal, Adult, Care After This sheet gives you information about how to care for yourself after your procedure. Your health care provider may also give you more specific instructions. If you have problems or questions, contact your health care provider. What can I expect after the procedure? After the procedure, it is common to have:  Pain.  Redness.  Swelling.  Soreness. Follow these instructions at home:  If you have a splint: ? Do not put pressure on any part of the splint until it is fully hardened. This may take several hours. ? Wear the splint as told by your health care provider. Remove it only as told by your health care provider. ? Loosen the splint if your fingers or toes tingle, become numb, or turn cold and blue. ? Keep the splint clean. ? If the splint is not waterproof:  Do not let it get wet.  Cover it with a watertight covering when you take a bath or a shower. Wound care   Follow instructions from your health care provider about how to take care of your wound. Make sure you: ? Wash your hands with soap and water before you change your bandage (dressing). If soap and water are not available, use hand sanitizer. ? Change your dressing as told by your health care provider. ? Keep your dressing dry until your health care provider says it can be removed. ? Leave stitches (sutures), skin glue, or adhesive strips in place. These skin closures may need to stay in place for 2 weeks or longer. If adhesive strip edges  start to loosen and curl up, you may trim the loose edges. Do not remove adhesive strips completely unless your health care provider tells you to do that.  Check your wound every day for signs of infection. Check for: ? More redness, swelling, or pain. ? More fluid or blood. ? Warmth. ? Pus or a bad smell. Managing pain, stiffness, and swelling  Move your fingers or toes often to avoid stiffness and to lessen swelling.  Raise (elevate) the injured area above the level of your heart while you are sitting or lying down. You may need to keep your finger or toe raised or supported on a pillow for 24 hours or as told by your health care provider.  Soak your hand or foot in warm, soapy water for 10-20 minutes, 3 times a day or as told by your health care provider. Medicine  Take over-the-counter and prescription medicines only as told by your health care provider.  If you were prescribed an antibiotic medicine, use it as told by your health care provider. Do not stop using the antibiotic even if your condition improves. General instructions  If you were given a shoe to wear, wear it as told by your health care provider.  Keep all follow-up visits as told by your health care provider. This is important. Contact a health care provider if:  You have  more redness, swelling, or pain around your wound.  You have more fluid or blood coming from your wound.  Your wound feels warm to the touch.  You have pus or a bad smell coming from your wound.  You have a fever.  Your finger or toe looks blue or black. This information is not intended to replace advice given to you by your health care provider. Make sure you discuss any questions you have with your health care provider. Document Released: 10/21/2014 Document Revised: 05/29/2016 Document Reviewed: 04/08/2016 Elsevier Interactive Patient Education  2019 Elsevier Inc.   Onychomycosis/Fungal Toenails  WHAT IS IT? An infection that lies  within the keratin of your nail plate that is caused by a fungus.  WHY ME? Fungal infections affect all ages, sexes, races, and creeds.  There may be many factors that predispose you to a fungal infection such as age, coexisting medical conditions such as diabetes, or an autoimmune disease; stress, medications, fatigue, genetics, etc.  Bottom line: fungus thrives in a warm, moist environment and your shoes offer such a location.  IS IT CONTAGIOUS? Theoretically, yes.  You do not want to share shoes, nail clippers or files with someone who has fungal toenails.  Walking around barefoot in the same room or sleeping in the same bed is unlikely to transfer the organism.  It is important to realize, however, that fungus can spread easily from one nail to the next on the same foot.  HOW DO WE TREAT THIS?  There are several ways to treat this condition.  Treatment may depend on many factors such as age, medications, pregnancy, liver and kidney conditions, etc.  It is best to ask your doctor which options are available to you.  1. No treatment.   Unlike many other medical concerns, you can live with this condition.  However for many people this can be a painful condition and may lead to ingrown toenails or a bacterial infection.  It is recommended that you keep the nails cut short to help reduce the amount of fungal nail. 2. Topical treatment.  These range from herbal remedies to prescription strength nail lacquers.  About 40-50% effective, topicals require twice daily application for approximately 9 to 12 months or until an entirely new nail has grown out.  The most effective topicals are medical grade medications available through physicians offices. 3. Oral antifungal medications.  With an 80-90% cure rate, the most common oral medication requires 3 to 4 months of therapy and stays in your system for a year as the new nail grows out.  Oral antifungal medications do require blood work to make sure it is a safe  drug for you.  A liver function panel will be performed prior to starting the medication and after the first month of treatment.  It is important to have the blood work performed to avoid any harmful side effects.  In general, this medication safe but blood work is required. 4. Laser Therapy.  This treatment is performed by applying a specialized laser to the affected nail plate.  This therapy is noninvasive, fast, and non-painful.  It is not covered by insurance and is therefore, out of pocket.  The results have been very good with a 80-95% cure rate.  [facility] is the only practice in the area to offer this therapy. 5. Permanent Nail Avulsion.  Removing the entire nail so that a new nail will not grow back.

## 2018-12-23 ENCOUNTER — Telehealth: Payer: Self-pay

## 2018-12-23 NOTE — Telephone Encounter (Signed)
-----   Message from Vicie Mutters, [place] sent at 12/23/2018  1:44 PM EDT ----- Regarding: RE: med question Contact: [phone] We did a removal in February. She needs to be on lamisil x 3 months total and then we will look at her toe  Estill Bamberg ----- Message ----- From: Elenor Quinones, CMA Sent: 12/23/2018  12:30 PM EDT To: Vicie Mutters, PA-C Subject: med question                                   PER YELLOW OFFICE NOTE:   Pt reports             Called wanting to know if she should cont taking THE ANTIFUNGAL medication. She also stated that once her toe nail was removed at the end of the day she was told by the provider that someone would call to set that appt up which she reports no one did. So she called to set that up her self.                  I was trying to help the patient figure out first what med it was due to her not knowing the name of said med which I thought was LAMASIL, but the patient got upset with me and told me not assume what med she was taking & to not huff at her, which I did not was just taking a natural breathe & then she stopped me in the middle of the sentence to say I have another call. So I did not get anywhere to explain anything, but I did say I would send you a message in order to ascertain what she is taking & should she keep taking the med.   Please advise  Please and thank you

## 2018-12-23 NOTE — Telephone Encounter (Signed)
Message left for patient to return my call.I will continue to try later. (Follow up call)

## 2018-12-24 ENCOUNTER — Telehealth: Payer: Self-pay

## 2018-12-24 ENCOUNTER — Encounter: Payer: Self-pay | Admitting: Physician Assistant

## 2018-12-24 NOTE — Telephone Encounter (Signed)
-----   Message from Vicie Mutters, [place] sent at 12/23/2018  1:44 PM EDT ----- Regarding: RE: med question Contact: [phone] We did a removal in February. She needs to be on lamisil x 3 months total and then we will look at her toe  Estill Bamberg ----- Message ----- From: Elenor Quinones, CMA Sent: 12/23/2018  12:30 PM EDT To: Vicie Mutters, PA-C Subject: med question                                   PER YELLOW OFFICE NOTE:   Pt reports             Called wanting to know if she should cont taking THE ANTIFUNGAL medication. She also stated that once her toe nail was removed at the end of the day she was told by the provider that someone would call to set that appt up which she reports no one did. So she called to set that up her self.                  I was trying to help the patient figure out first what med it was due to her not knowing the name of said med which I thought was LAMASIL, but the patient got upset with me and told me not assume what med she was taking & to not huff at her, which I did not was just taking a natural breathe & then she stopped me in the middle of the sentence to say I have another call. So I did not get anywhere to explain anything, but I did say I would send you a message in order to ascertain what she is taking & should she keep taking the med.   Please advise  Please and thank you

## 2018-12-24 NOTE — Telephone Encounter (Signed)
Patient has not return call about appt. Need to cancel appt tomorrow and reschedule in 38mths for follow up on lamisil med   Message given to front office in order to cancel appt on 3.13.20 & to reschedule for 2mths follow up on toe nail/LAMISIL & lab bld work.

## 2018-12-25 ENCOUNTER — Other Ambulatory Visit: Payer: 59

## 2019-03-07 ENCOUNTER — Encounter: Payer: Self-pay | Admitting: Emergency Medicine

## 2019-03-07 ENCOUNTER — Emergency Department (INDEPENDENT_AMBULATORY_CARE_PROVIDER_SITE_OTHER)
Admission: EM | Admit: 2019-03-07 | Discharge: 2019-03-07 | Disposition: A | Discharge status: Home / Self Care | Payer: BLUE CROSS/BLUE SHIELD | Source: Home / Self Care | Attending: Family Medicine | Admitting: Family Medicine

## 2019-03-07 ENCOUNTER — Other Ambulatory Visit: Payer: Self-pay

## 2019-03-07 DIAGNOSIS — R21 Rash and other nonspecific skin eruption: Secondary | ICD-10-CM | POA: Diagnosis not present

## 2019-03-07 LAB — POCT CBC W AUTO DIFF (K'VILLE URGENT CARE)

## 2019-03-07 MED ORDER — PREDNISONE 20 MG PO TABS: ORAL_TABLET | ORAL | 0 refills | Status: DC

## 2019-03-07 NOTE — Discharge Instructions (Addendum)
May take Benadryl as needed for itching, and may also apply 1% hyrdrocortisone cream 2 or 3 times daily.  May take Tylenol as needed for pain.

## 2019-03-07 NOTE — ED Triage Notes (Signed)
Patient awoke today with fine rash on hands/wrists and legs. One week ago she may have been stung on left arm. Yesterday she hand washed cloth masks in strong lysol detergent. She is not having trouble breathing. The rash is painful.

## 2019-03-07 NOTE — ED Provider Notes (Signed)
[facility]    CSN: [id number] Arrival date & time: 03/07/19  1153     History   Chief Complaint Chief Complaint  Patient presents with  . Rash    HPI Laura Kelley is a 53 y.o. female.   Patient reports that yesterday she hand washed cloth masks in a concentrated Lysol solution detergent.  Last night she developed soreness and mild swelling in her fingertips, and today awoke with an uncomfortable rash on her hands and wrists, with a lesser amount on her arms and legs.  She denies mouth lesions, fever/chills, and feels well otherwise.   The history is provided by the patient.  Rash  Location: hands, arms, and upper legs. Quality: burning, dryness, itchiness, painful and redness   Quality: not blistering, not bruising, not draining, not peeling, not scaling, not swelling and not weeping   Pain details:    Quality:  Burning and itching   Severity:  Mild   Onset quality:  Sudden   Duration:  1 day   Timing:  Constant   Progression:  Unchanged Severity:  Mild Onset quality:  Sudden Duration:  1 day Timing:  Constant Progression:  Unchanged Chronicity:  New Context: new detergent/soap   Context: not animal contact, not chemical exposure, not exposure to similar rash, not food, not hot tub use, not insect bite/sting, not medications, not nuts, not plant contact, not pollen, not sick contacts and not sun exposure   Relieved by:  Nothing Worsened by:  Nothing Ineffective treatments:  None tried Associated symptoms: no abdominal pain, no diarrhea, no fatigue, no fever, no headaches, no hoarse voice, no induration, no joint pain, no myalgias, no nausea, no periorbital edema, no shortness of breath, no sore throat, no throat swelling, no tongue swelling, no URI and not wheezing     Past Medical History:  Diagnosis Date  . GERD (gastroesophageal reflux disease)    occ s/s  . History of duodenal ulcer 06/03/2012   + Hpylori treated 2013   . Hyperlipidemia   .  Ulcer   . Vitamin D deficiency     Patient Active Problem List   Diagnosis Date Noted  . Tobacco use disorder 08/31/2015  . Other abnormal glucose 08/31/2015  . Obesity 08/31/2015  . Hyperlipidemia   . GERD (gastroesophageal reflux disease)   . Vitamin D deficiency   . Family history of colonic polyps 05/11/2012  . DIARRHEA 07/28/2009    Past Surgical History:  Procedure Laterality Date  . [place]   x 2  . ESOPHAGOGASTRODUODENOSCOPY  06/03/2012   Procedure: ESOPHAGOGASTRODUODENOSCOPY (EGD);  Surgeon: Ladene Artist, MD,FACG;  Location: [facility];  Service: Endoscopy;  Laterality: N/A;  . LUMBAR DISC SURGERY     Dr. Durene Cal    OB History   No obstetric history on file.      Home Medications    Prior to Admission medications   Medication Sig Start Date End Date Taking? Authorizing Provider  ALPRAZolam Duanne Moron) 1 MG tablet Take 1 tablet (1 mg total) by mouth at bedtime as needed for anxiety. 11/20/18   Vicie Mutters, PA-C  predniSONE (DELTASONE) 20 MG tablet Take one tab by mouth twice daily for 4 days, then one daily. Take with food. 03/07/19   Kandra Nicolas, MD  terbinafine (LAMISIL) 250 MG tablet Take 1 tablet (250 mg total) by mouth daily. Take 1 pill daily for 3 months, get LFTs checked 6 weeks 11/20/18   Vicie Mutters, PA-C  Family History Family History  Problem Relation Age of Onset  . Colon polyps Mother   . Multiple sclerosis Mother   . Hemochromatosis Brother   . Colon cancer Neg Hx   . Rectal cancer Neg Hx   . Stomach cancer Neg Hx     Social History Social History   Tobacco Use  . Smoking status: Current Every Day Smoker    Packs/day: 0.50    Years: 17.00    Pack years: 8.50    Types: Cigarettes  . Smokeless tobacco: Never Used  . Tobacco comment: Tobacco info given 05/11/12  Substance Use Topics  . Alcohol use: Yes    Comment: occ. mixed drink on the weekends  . Drug use: No     Allergies   Other   Review  of Systems Review of Systems  Constitutional: Negative for fatigue and fever.  HENT: Negative for hoarse voice and sore throat.   Respiratory: Negative for shortness of breath and wheezing.   Gastrointestinal: Negative for abdominal pain, diarrhea and nausea.  Musculoskeletal: Negative for arthralgias and myalgias.  Skin: Positive for rash.  Neurological: Negative for headaches.     Physical Exam Triage Vital Signs ED Triage Vitals  Enc Vitals Group     BP 03/07/19 1310 113/76     Pulse Rate 03/07/19 1310 73     Resp 03/07/19 1310 16     Temp 03/07/19 1310 (!) 97.5 F (36.4 C)     Temp Source 03/07/19 1310 Oral     SpO2 03/07/19 1310 98 %     Weight 03/07/19 1311 165 lb (74.8 kg)     Height 03/07/19 1311 5\' 1"  (1.549 m)     Head Circumference --      Peak Flow --      Pain Score 03/07/19 1311 3     Pain Loc --      Pain Edu? --      Excl. in [place]? --    No data found.  Updated Vital Signs BP 113/76 (BP Location: Right Arm)   Pulse 73   Temp (!) 97.5 F (36.4 C) (Oral)   Resp 16   Ht 5\' 1"  (1.549 m)   Wt 74.8 kg   LMP 07/24/2014   SpO2 98%   BMI 31.18 kg/m   Visual Acuity Right Eye Distance:   Left Eye Distance:   Bilateral Distance:    Right Eye Near:   Left Eye Near:    Bilateral Near:     Physical Exam Nursing notes and Vital Signs reviewed. Appearance:  Patient appears stated age, and in no acute distress.    Eyes:  Pupils are equal, round, and reactive to light and accomodation.  Extraocular movement is intact.  Conjunctivae are not inflamed   Mouth:  Normal, no lesions Pharynx:  Normal; moist mucous membranes  Neck:  Supple.  No adenopathy Lungs:  Clear to auscultation.  Breath sounds are equal.  Moving air well. Heart:  Regular rate and rhythm without murmurs, rubs, or gallops.  Abdomen:  Nontender without masses or hepatosplenomegaly.  Bowel sounds are present.  No CVA or flank tenderness.  Extremities:  No edema.  Skin:  Mild erythema of palms.   Dorsa of hands and fingers have slightly raised 1 to 73mm nontender erythematous macules, extending to wrists.  Few lesions are present on arms and upper legs; none on feet.    UC Treatments / Results  Labs (all labs ordered are listed, but only  abnormal results are displayed) Labs Reviewed  POCT CBC W AUTO DIFF ([place]):  WBC 9.2; LY 26.8; MO 7.3; GR 65.9; Hgb 14.3; Platelets 328     EKG None  Radiology No results found.  Procedures Procedures (including critical care time)  Medications Ordered in UC Medications - No data to display  Initial Impression / Assessment and Plan / UC Course  I have reviewed the triage vital signs and the nursing notes.  Pertinent labs & imaging results that were available during my care of the patient were reviewed by me and considered in my medical decision making (see chart for details).    Normal CBC reassuring. Suspect contact dermatitis (Lysol). ?hand foot mouth disease (without mouth lesions). Begin prednisone burst/taper. Followup with dermatologist if not resolved in about 10 days.   Final Clinical Impressions(s) / UC Diagnoses   Final diagnoses:  Rash and nonspecific skin eruption     Discharge Instructions     May take Benadryl as needed for itching, and may also apply 1% hyrdrocortisone cream 2 or 3 times daily.  May take Tylenol as needed for pain.    ED Prescriptions    Medication Sig Dispense Auth. Provider   predniSONE (DELTASONE) 20 MG tablet Take one tab by mouth twice daily for 4 days, then one daily. Take with food. 12 tablet Kandra Nicolas, MD       Kandra Nicolas, MD 03/08/19 [phone]

## 2019-04-02 ENCOUNTER — Encounter: Payer: Self-pay | Admitting: Physician Assistant

## 2020-12-21 ENCOUNTER — Encounter: Payer: Self-pay | Admitting: Gastroenterology

## 2021-01-08 ENCOUNTER — Ambulatory Visit (AMBULATORY_SURGERY_CENTER): Payer: BC Managed Care – PPO

## 2021-01-08 ENCOUNTER — Other Ambulatory Visit: Payer: Self-pay

## 2021-01-08 VITALS — Ht 61.0 in | Wt 163.0 lb

## 2021-01-08 DIAGNOSIS — Z1211 Encounter for screening for malignant neoplasm of colon: Secondary | ICD-10-CM

## 2021-01-08 MED ORDER — NA SULFATE-K SULFATE-MG SULF 17.5-3.13-1.6 GM/177ML PO SOLN: 1.0000 | Freq: Once | ORAL | 0 refills | Status: AC

## 2021-01-08 NOTE — Progress Notes (Signed)
No allergies to soy or egg Pt is not on blood thinners or diet pills Denies issues with sedation/intubation Denies atrial flutter/fib Denies constipation   Pt is aware of Covid safety and care partner requirements.   Pt verified name, DOB, address and insurance during PV today.   Pt mailed instruction packet to included paper to complete and mail back to [facility] with addressed and stamped envelope, , copy of consent form to read and not return, and instructions. suprep coupon mailed in packet. PV completed over the phone. Pt encouraged to call with questions or issues.

## 2021-01-25 ENCOUNTER — Encounter: Payer: Self-pay | Admitting: Gastroenterology

## 2021-01-30 ENCOUNTER — Ambulatory Visit (AMBULATORY_SURGERY_CENTER): Payer: BC Managed Care – PPO | Admitting: Gastroenterology

## 2021-01-30 ENCOUNTER — Other Ambulatory Visit: Payer: Self-pay

## 2021-01-30 ENCOUNTER — Encounter: Payer: Self-pay | Admitting: Gastroenterology

## 2021-01-30 VITALS — BP 107/57 | HR 69 | Temp 98.0°F | Resp 14 | Ht 61.0 in | Wt 163.0 lb

## 2021-01-30 DIAGNOSIS — K635 Polyp of colon: Secondary | ICD-10-CM

## 2021-01-30 DIAGNOSIS — Z86010 Personal history of colonic polyps: Secondary | ICD-10-CM | POA: Diagnosis not present

## 2021-01-30 DIAGNOSIS — D125 Benign neoplasm of sigmoid colon: Secondary | ICD-10-CM

## 2021-01-30 DIAGNOSIS — Z8371 Family history of colonic polyps: Secondary | ICD-10-CM

## 2021-01-30 DIAGNOSIS — D127 Benign neoplasm of rectosigmoid junction: Secondary | ICD-10-CM

## 2021-01-30 DIAGNOSIS — K621 Rectal polyp: Secondary | ICD-10-CM

## 2021-01-30 DIAGNOSIS — D128 Benign neoplasm of rectum: Secondary | ICD-10-CM

## 2021-01-30 MED ORDER — SODIUM CHLORIDE 0.9 % IV SOLN: 500.0000 mL | Freq: Once | INTRAVENOUS | Status: DC

## 2021-01-30 NOTE — Progress Notes (Signed)
VS-JD  Pt's states no medical or surgical changes since previsit or office visit.

## 2021-01-30 NOTE — Patient Instructions (Signed)
Read all of the handouts given to you by your recovery room nurse.  YOU HAD AN ENDOSCOPIC PROCEDURE TODAY AT [facility]:   Refer to the procedure report that was given to you for any specific questions about what was found during the examination.  If the procedure report does not answer your questions, please call your gastroenterologist to clarify.  If you requested that your care partner not be given the details of your procedure findings, then the procedure report has been included in a sealed envelope for you to review at your convenience later.  YOU SHOULD EXPECT: Some feelings of bloating in the abdomen. Passage of more gas than usual.  Walking can help get rid of the air that was put into your GI tract during the procedure and reduce the bloating. If you had a lower endoscopy (such as a colonoscopy or flexible sigmoidoscopy) you may notice spotting of blood in your stool or on the toilet paper. If you underwent a bowel prep for your procedure, you may not have a normal bowel movement for a few days.  Please Note:  You might notice some irritation and congestion in your nose or some drainage.  This is from the oxygen used during your procedure.  There is no need for concern and it should clear up in a day or so.  SYMPTOMS TO REPORT IMMEDIATELY:   Following lower endoscopy (colonoscopy or flexible sigmoidoscopy):  Excessive amounts of blood in the stool  Significant tenderness or worsening of abdominal pains  Swelling of the abdomen that is new, acute  Fever of 100F or higher   For urgent or emergent issues, a gastroenterologist can be reached at any hour by calling [phone]. Do not use MyChart messaging for urgent concerns.    DIET:  We do recommend a small meal at first, but then you may proceed to your regular diet.  Drink plenty of fluids but you should avoid alcoholic beverages for 24 hours. Try to increase the fiber I your diet, and drink plenty of  water.  ACTIVITY:  You should plan to take it easy for the rest of today and you should NOT DRIVE or use heavy machinery until tomorrow (because of the sedation medicines used during the test).    FOLLOW UP: Our staff will call the number listed on your records 48-72 hours following your procedure to check on you and address any questions or concerns that you may have regarding the information given to you following your procedure. If we do not reach you, we will leave a message.  We will attempt to reach you two times.  During this call, we will ask if you have developed any symptoms of COVID 19. If you develop any symptoms (ie: fever, flu-like symptoms, shortness of breath, cough etc.) before then, please call [phone].  If you test positive for Covid 19 in the 2 weeks post procedure, please call and report this information to [place].    If any biopsies were taken you will be contacted by phone or by letter within the next 1-3 weeks.  Please call us at [phone] if you have not heard about the biopsies in 3 weeks.    SIGNATURES/CONFIDENTIALITY: You and/or your care partner have signed paperwork which will be entered into your electronic medical record.  These signatures attest to the fact that that the information above on your After Visit Summary has been reviewed and is understood.  Full responsibility of the confidentiality of  this discharge information lies with you and/or your care-partner.

## 2021-01-30 NOTE — Progress Notes (Signed)
To PACU, VSS. Report to [email or web address] 

## 2021-01-30 NOTE — Progress Notes (Signed)
Called to room to assist during endoscopic procedure.  Patient ID and intended procedure confirmed with present staff. Received instructions for my participation in the procedure from the performing physician.  

## 2021-01-30 NOTE — Op Note (Signed)
[place] Patient Name: Laura Kelley Procedure Date: 01/30/2021 2:00 PM MRN: [id number] Endoscopist: Ladene Artist , MD Age: 55 Referring MD:  Date of Birth: May 30, 1966 Gender: Female Account #: [id number] Procedure:                Colonoscopy Indications:              Colon cancer screening in patient at increased                            risk: Family history of 1st-degree relative with                            colon polyps Medicines:                Monitored Anesthesia Care Procedure:                Pre-Anesthesia Assessment:                           - Prior to the procedure, a History and Physical                            was performed, and patient medications and                            allergies were reviewed. The patient's tolerance of                            previous anesthesia was also reviewed. The risks                            and benefits of the procedure and the sedation                            options and risks were discussed with the patient.                            All questions were answered, and informed consent                            was obtained. Prior Anticoagulants: The patient has                            taken no previous anticoagulant or antiplatelet                            agents. ASA Grade Assessment: II - A patient with                            mild systemic disease. After reviewing the risks                            and benefits, the patient was deemed in  satisfactory condition to undergo the procedure.                           After obtaining informed consent, the colonoscope                            was passed under direct vision. Throughout the                            procedure, the patient's blood pressure, pulse, and                            oxygen saturations were monitored continuously. The                            Olympus PCF-H190DL [id number]) Colonoscope was                             introduced through the anus and advanced to the the                            cecum, identified by appendiceal orifice and                            ileocecal valve. The ileocecal valve, appendiceal                            orifice, and rectum were photographed. The quality                            of the bowel preparation was adequate. The                            colonoscopy was performed without difficulty. The                            patient tolerated the procedure well. Scope In: 2:03:59 PM Scope Out: 2:22:27 PM Scope Withdrawal Time: 0 hours 15 minutes 21 seconds  Total Procedure Duration: 0 hours 18 minutes 28 seconds  Findings:                 The perianal and digital rectal examinations were                            normal.                           Seven sessile polyps were found in the rectum (2)                            and sigmoid colon (5). The polyps were 5 to 8 mm in                            size. These polyps were removed with a cold snare.  Resection and retrieval were complete.                           A few small-mouthed diverticula were found in the                            sigmoid colon.                           Anal papilla(e) were hypertrophied.                           Internal hemorrhoids were found during                            retroflexion. The hemorrhoids were moderate and                            Grade I (internal hemorrhoids that do not prolapse).                           The exam was otherwise without abnormality on                            direct and retroflexion views. Complications:            No immediate complications. Estimated blood loss:                            None. Estimated Blood Loss:     Estimated blood loss: none. Impression:               - Seven 5 to 8 mm polyps in the rectum and in the                            sigmoid colon, removed with a cold snare.  Resected                            and retrieved.                           - Mild diverticulosis in the sigmoid colon.                           - Anal papilla(e) were hypertrophied.                           - Internal hemorrhoids.                           - The examination was otherwise normal on direct                            and retroflexion views. Recommendation:           - Repeat colonoscopy after studies are complete for  surveillance based on pathology results.                           - Patient has a contact number available for                            emergencies. The signs and symptoms of potential                            delayed complications were discussed with the                            patient. Return to normal activities tomorrow.                            Written discharge instructions were provided to the                            patient.                           - High fiber diet.                           - Continue present medications.                           - Await pathology results. Ladene Artist, MD 01/30/2021 2:25:49 PM This report has been signed electronically.

## 2021-02-01 ENCOUNTER — Telehealth: Payer: Self-pay | Admitting: *Deleted

## 2021-02-01 NOTE — Telephone Encounter (Signed)
  Follow up Call-  Call back number 01/30/2021  Post procedure Call Back phone  # [phone]  Permission to leave phone message Yes  Some recent data might be hidden     Patient questions:  Do you have a fever, pain , or abdominal swelling? No. Pain Score  0 *  Have you tolerated food without any problems? Yes.    Have you been able to return to your normal activities? Yes.    Do you have any questions about your discharge instructions: Diet   No. Medications  No. Follow up visit  No.  Do you have questions or concerns about your Care? No.  Actions: * If pain score is 4 or above: No action needed, pain <4.  1. Have you developed a fever since your procedure? no  2.   Have you had an respiratory symptoms (SOB or cough) since your procedure? no  3.   Have you tested positive for COVID 19 since your procedure no  4.   Have you had any family members/close contacts diagnosed with the COVID 19 since your procedure?  no   If yes to any of these questions please route to Joylene John, RN and Joella Prince, RN

## 2021-02-12 ENCOUNTER — Encounter: Payer: Self-pay | Admitting: Gastroenterology

## 2022-07-07 ENCOUNTER — Other Ambulatory Visit: Payer: Self-pay

## 2022-07-07 ENCOUNTER — Encounter (HOSPITAL_BASED_OUTPATIENT_CLINIC_OR_DEPARTMENT_OTHER): Payer: Self-pay | Admitting: Urology

## 2022-07-07 ENCOUNTER — Emergency Department (HOSPITAL_BASED_OUTPATIENT_CLINIC_OR_DEPARTMENT_OTHER)
Admission: EM | Admit: 2022-07-07 | Discharge: 2022-07-07 | Disposition: A | Discharge status: Home / Self Care | Payer: 59 | Attending: Emergency Medicine | Admitting: Emergency Medicine

## 2022-07-07 DIAGNOSIS — Z28310 Unvaccinated for covid-19: Secondary | ICD-10-CM | POA: Diagnosis not present

## 2022-07-07 DIAGNOSIS — K219 Gastro-esophageal reflux disease without esophagitis: Secondary | ICD-10-CM | POA: Diagnosis not present

## 2022-07-07 DIAGNOSIS — R1084 Generalized abdominal pain: Secondary | ICD-10-CM | POA: Diagnosis not present

## 2022-07-07 DIAGNOSIS — R197 Diarrhea, unspecified: Secondary | ICD-10-CM | POA: Diagnosis present

## 2022-07-07 DIAGNOSIS — U071 COVID-19: Secondary | ICD-10-CM | POA: Insufficient documentation

## 2022-07-07 LAB — SARS CORONAVIRUS 2 BY RT PCR: SARS Coronavirus 2 by RT PCR: POSITIVE — AB

## 2022-07-07 LAB — URINALYSIS, MICROSCOPIC (REFLEX)

## 2022-07-07 LAB — URINALYSIS, ROUTINE W REFLEX MICROSCOPIC
Bilirubin Urine: NEGATIVE
Glucose, UA: NEGATIVE mg/dL
Ketones, ur: 40 mg/dL — AB
Leukocytes,Ua: NEGATIVE
Nitrite: NEGATIVE
Protein, ur: NEGATIVE mg/dL
Specific Gravity, Urine: 1.025 (ref 1.005–1.030)
pH: 5 (ref 5.0–8.0)

## 2022-07-07 LAB — CBC
HCT: 41.2 % (ref 36.0–46.0)
Hemoglobin: 14.4 g/dL (ref 12.0–15.0)
MCH: 32.1 pg (ref 26.0–34.0)
MCHC: 35 g/dL (ref 30.0–36.0)
MCV: 92 fL (ref 80.0–100.0)
Platelets: 243 10*3/uL (ref 150–400)
RBC: 4.48 MIL/uL (ref 3.87–5.11)
RDW: 13.2 % (ref 11.5–15.5)
WBC: 5 10*3/uL (ref 4.0–10.5)
nRBC: 0 % (ref 0.0–0.2)

## 2022-07-07 LAB — COMPREHENSIVE METABOLIC PANEL
ALT: 74 U/L — ABNORMAL HIGH (ref 0–44)
AST: 65 U/L — ABNORMAL HIGH (ref 15–41)
Albumin: 3.8 g/dL (ref 3.5–5.0)
Alkaline Phosphatase: 68 U/L (ref 38–126)
Anion gap: 8 (ref 5–15)
BUN: 12 mg/dL (ref 6–20)
CO2: 22 mmol/L (ref 22–32)
Calcium: 8.8 mg/dL — ABNORMAL LOW (ref 8.9–10.3)
Chloride: 110 mmol/L (ref 98–111)
Creatinine, Ser: 0.52 mg/dL (ref 0.44–1.00)
GFR, Estimated: >60 mL/min (ref >60)
Glucose, Bld: 97 mg/dL (ref 70–99)
Potassium: 3.7 mmol/L (ref 3.5–5.1)
Sodium: 140 mmol/L (ref 135–145)
Total Bilirubin: 0.4 mg/dL (ref 0.3–1.2)
Total Protein: 7.1 g/dL (ref 6.5–8.1)

## 2022-07-07 LAB — LIPASE, BLOOD: Lipase: 34 U/L (ref 11–51)

## 2022-07-07 LAB — PREGNANCY, URINE: Preg Test, Ur: NEGATIVE

## 2022-07-07 MED ORDER — ONDANSETRON 4 MG PO TBDP: 4.0000 mg | ORAL_TABLET | Freq: Three times a day (TID) | ORAL | 0 refills | Status: DC | PRN

## 2022-07-07 MED ORDER — DICYCLOMINE HCL 20 MG PO TABS: 20.0000 mg | ORAL_TABLET | Freq: Two times a day (BID) | ORAL | 0 refills | Status: DC

## 2022-07-07 MED ORDER — NIRMATRELVIR/RITONAVIR (PAXLOVID)TABLET: 3.0000 | ORAL_TABLET | Freq: Two times a day (BID) | ORAL | 0 refills | Status: AC

## 2022-07-07 MED ORDER — ONDANSETRON 4 MG PO TBDP
4.0000 mg | ORAL_TABLET | Freq: Once | ORAL | Status: AC | PRN
  Administered 2022-07-07: 4 mg via ORAL
  Filled 2022-07-07: qty 1

## 2022-07-07 MED ORDER — ONDANSETRON 4 MG PO TBDP
4.0000 mg | ORAL_TABLET | Freq: Once | ORAL | Status: AC
  Administered 2022-07-07: 4 mg via ORAL
  Filled 2022-07-07: qty 1

## 2022-07-07 MED ORDER — SODIUM CHLORIDE 0.9 % IV BOLUS
1000.0000 mL | Freq: Once | INTRAVENOUS | Status: AC
  Administered 2022-07-07: 1000 mL via INTRAVENOUS

## 2022-07-07 MED ORDER — BENZONATATE 100 MG PO CAPS: 100.0000 mg | ORAL_CAPSULE | Freq: Three times a day (TID) | ORAL | 0 refills | Status: DC

## 2022-07-07 NOTE — ED Notes (Signed)
Pt reports relief of nausea after zofran admin.  See [facility]

## 2022-07-07 NOTE — ED Provider Notes (Signed)
[facility] Provider Note   CSN: [id number] Arrival date & time: 07/07/22  2047     History  Chief Complaint  Patient presents with   Covid Positive    Laura Kelley is a 56 y.o. female with a past medical history significant for hyperlipidemia and GERD who presents to the ED due to nausea, vomiting, and diarrhea.  Patient started having myalgias on Tuesday and tested positive for COVID-19 on Wednesday.  Patient states she has been having persistent diarrhea over the past few days.  Diarrhea is nonbloody.  Patient also admits to a few episodes of nonbloody, nonbilious emesis.  Admits to generalized abdominal pain.  No chest pain or shortness of breath.  Admits to a headache.  Patient is unvaccinated. Patient is concerned that she may be dehydrated.  History obtained from patient and past medical records. No interpreter used during encounter.       Home Medications Prior to Admission medications   Medication Sig Start Date End Date Taking? Authorizing Provider  benzonatate (TESSALON) 100 MG capsule Take 1 capsule (100 mg total) by mouth every 8 (eight) hours. 07/07/22  Yes Nahun Kronberg, Druscilla Brownie, PA-C  dicyclomine (BENTYL) 20 MG tablet Take 1 tablet (20 mg total) by mouth 2 (two) times daily. 07/07/22  Yes Leyani Gargus C, PA-C  ondansetron (ZOFRAN-ODT) 4 MG disintegrating tablet Take 1 tablet (4 mg total) by mouth every 8 (eight) hours as needed for nausea or vomiting. 07/07/22  Yes Aurther Harlin, Druscilla Brownie, PA-C  ALPRAZolam (XANAX) 1 MG tablet Take 1 tablet (1 mg total) by mouth at bedtime as needed for anxiety. 11/20/18   Vladimir Crofts, PA-C      Allergies    No known allergies and Other    Review of Systems   Review of Systems  Constitutional:  Positive for chills and fatigue. Negative for fever.  Respiratory:  Positive for cough. Negative for shortness of breath.   Cardiovascular:  Negative for chest pain.  Gastrointestinal:  Positive for  abdominal pain, diarrhea, nausea and vomiting.  Genitourinary:  Negative for dysuria.  All other systems reviewed and are negative.   Physical Exam Updated Vital Signs BP (!) 167/84 (BP Location: Right Arm)   Pulse 72   Temp 98.5 F (36.9 C) (Oral)   Resp 18   Ht '5\' 1"'$  (1.549 m)   Wt 70.3 kg   LMP 07/24/2014   SpO2 96%   BMI 29.29 kg/m  Physical Exam Vitals and nursing note reviewed.  Constitutional:      General: She is not in acute distress.    Appearance: She is not ill-appearing.  HENT:     Head: Normocephalic.  Eyes:     Pupils: Pupils are equal, round, and reactive to light.  Cardiovascular:     Rate and Rhythm: Normal rate and regular rhythm.     Pulses: Normal pulses.     Heart sounds: Normal heart sounds. No murmur heard.    No friction rub. No gallop.  Pulmonary:     Effort: Pulmonary effort is normal.     Breath sounds: Normal breath sounds.  Abdominal:     General: Abdomen is flat. There is no distension.     Palpations: Abdomen is soft.     Tenderness: There is no abdominal tenderness. There is no guarding or rebound.     Comments: Abdomen soft, nondistended, nontender to palpation in all quadrants without guarding or peritoneal signs. No rebound.   Musculoskeletal:  General: Normal range of motion.     Cervical back: Neck supple.  Skin:    General: Skin is warm and dry.  Neurological:     General: No focal deficit present.     Mental Status: She is alert.  Psychiatric:        Mood and Affect: Mood normal.        Behavior: Behavior normal.     ED Results / Procedures / Treatments   Labs (all labs ordered are listed, but only abnormal results are displayed) Labs Reviewed  SARS CORONAVIRUS 2 BY RT PCR - Abnormal; Notable for the following components:      Result Value   SARS Coronavirus 2 by RT PCR POSITIVE (*)    All other components within normal limits  URINALYSIS, ROUTINE W REFLEX MICROSCOPIC - Abnormal; Notable for the following  components:   Hgb urine dipstick SMALL (*)    Ketones, ur 40 (*)    All other components within normal limits  COMPREHENSIVE METABOLIC PANEL - Abnormal; Notable for the following components:   Calcium 8.8 (*)    AST 65 (*)    ALT 74 (*)    All other components within normal limits  URINALYSIS, MICROSCOPIC (REFLEX) - Abnormal; Notable for the following components:   Bacteria, UA RARE (*)    All other components within normal limits  CBC  PREGNANCY, URINE  LIPASE, BLOOD    EKG None  Radiology No results found.  Procedures Procedures    Medications Ordered in ED Medications  ondansetron (ZOFRAN-ODT) disintegrating tablet 4 mg (4 mg Oral Given 07/07/22 2059)  sodium chloride 0.9 % bolus 1,000 mL (1,000 mLs Intravenous New Bag/Given 07/07/22 2230)    ED Course/ Medical Decision Making/ A&P Clinical Course as of 07/07/22 2231  Sun Jul 07, 2022  2217 SARS Coronavirus 2 by RT PCR(!): POSITIVE [CA]  2217 AST(!): 65 [CA]  2217 ALT(!): 74 [CA]    Clinical Course User Index [CA] Suzy Bouchard, PA-C                           Medical Decision Making Amount and/or Complexity of Data Reviewed External Data Reviewed: notes. Labs: ordered. Decision-making details documented in ED Course.  Risk Prescription drug management.   56 year old female presents to the ED due to nausea, vomiting, and diarrhea.  Patient concerned about possible dehydration.  Patient tested positive for COVID-19 a few days ago.  Patient is unvaccinated.  Emesis is nonbloody, nonbilious.  Nonbloody diarrhea.  Upon arrival, stable vitals.  Patient is afebrile, not tachycardic or hypoxic.  Patient in no acute distress.  Patient denies chest pain or shortness of breath.  Benign physical exam.  Lungs clear to auscultation bilaterally.  Low suspicion for pneumonia.  Abdomen soft, nondistended, nontender.  Low suspicion for acute abdomen.  Routine labs ordered at triage.  Will give IV fluids due to numerous  episodes of emesis and diarrhea.  Zofran given.  CBC unremarkable.  No leukocytosis and normal hemoglobin.  Lipase normal at 34.  Doubt pancreatitis.  CMP significant for transaminitis with AST at 65 and ALT at 74.  Normal renal function.  No major electrolyte derangements.  COVID positive.  UA significant for small hematuria and ketonuria.  Rare bacteria.  No evidence of infection.  Suspect symptoms related to COVID-19 infection.  Low suspicion for any emergent etiologies of abdominal pain given abdomen soft, nondistended, nontender.  Upon reassessment after IV fluids,  patient admits to improvement in symptoms and is ready to be discharged.  Will discharge patient with symptomatic treatment and Paxlovid given patient is unvaccinated.  Suspect symptoms related to COVID-19.  Suspicion for any emergent etiologies of symptoms. Strict ED precautions discussed with patient. Patient states understanding and agrees to plan. Patient discharged home in no acute distress and stable vitals.        Final Clinical Impression(s) / ED Diagnoses Final diagnoses:  COVID-19 virus infection    Rx / DC Orders ED Discharge Orders          Ordered    ondansetron (ZOFRAN-ODT) 4 MG disintegrating tablet  Every 8 hours PRN        07/07/22 2230    dicyclomine (BENTYL) 20 MG tablet  2 times daily        07/07/22 2230    benzonatate (TESSALON) 100 MG capsule  Every 8 hours        07/07/22 2230              Karie Kirks 07/07/22 2338    Charlesetta Shanks, MD 07/14/22 1751

## 2022-07-07 NOTE — Discharge Instructions (Addendum)
It was a pleasure taking care of you today. As discussed, all of your labs were reassuring. I suspect your symptoms are related to COVID 19.  I am sending you home with symptomatic treatment.  Take as needed.  Please follow-up with PCP if symptoms do not improve over the next few days.  Return to the ER for new or worsening symptoms.

## 2022-07-07 NOTE — ED Notes (Signed)
Pt unable to urinate at this time, given cup while waiting

## 2022-07-07 NOTE — ED Triage Notes (Signed)
Pt states had covid symptoms since Tuesday, and tested positive at home on Wednesday  Pt c/o nausea and vomiting, unable to keep fluids down, states 'i think I'm dehydrated"

## 2022-07-10 ENCOUNTER — Ambulatory Visit: Payer: 59 | Admitting: Nurse Practitioner

## 2022-07-10 DIAGNOSIS — R0989 Other specified symptoms and signs involving the circulatory and respiratory systems: Secondary | ICD-10-CM | POA: Diagnosis not present

## 2022-07-10 DIAGNOSIS — R197 Diarrhea, unspecified: Secondary | ICD-10-CM | POA: Diagnosis not present

## 2022-07-10 DIAGNOSIS — R432 Parageusia: Secondary | ICD-10-CM

## 2022-07-10 DIAGNOSIS — U071 COVID-19: Secondary | ICD-10-CM | POA: Diagnosis not present

## 2022-07-10 DIAGNOSIS — R110 Nausea: Secondary | ICD-10-CM | POA: Diagnosis not present

## 2022-07-10 DIAGNOSIS — R5383 Other fatigue: Secondary | ICD-10-CM

## 2022-07-10 MED ORDER — PROMETHAZINE HCL 12.5 MG PO TABS: 12.5000 mg | ORAL_TABLET | Freq: Four times a day (QID) | ORAL | 0 refills | Status: DC | PRN

## 2022-07-10 MED ORDER — AZITHROMYCIN 500 MG PO TABS: 500.0000 mg | ORAL_TABLET | Freq: Every day | ORAL | 0 refills | Status: DC

## 2022-07-10 NOTE — Progress Notes (Signed)
THIS ENCOUNTER IS A VIRTUAL VISIT DUE TO COVID-19 - PATIENT WAS NOT SEEN IN THE OFFICE.  PATIENT HAS CONSENTED TO VIRTUAL VISIT / TELEMEDICINE VISIT   Virtual Visit via telephone Note  I connected with  Laura Kelley on 07/10/2022 by telephone.  I verified that I am speaking with the correct person using two identifiers.    I discussed the limitations of evaluation and management by telemedicine and the availability of in person appointments. The patient expressed understanding and agreed to proceed.  History of Present Illness:  LMP 07/24/2014  56 y.o. patient contacted office reporting URI sx onset 07/04/23. she tested positive by ED visit presenting with N/V/D. She was discharged with Ondansetron, Paxlovid, Dicyclomine and Benzonatate, She is not taking the Benzonatate.  She is continuing to have chest congestion , N/V/D, fatigue, loss of taste.  She is unable to keep any foods or fluids down.  She is unable to sleep. OV was conducted by telephone to minimize exposure. This patient has not been vaccinated for covid 19.  She has not had covid in the past.  Sx began 1 week ago with nausea  Treatments tried so far: Ondansetron, Paxlovid, Dicyclomine  Exposures: unknown   Medications    Current Outpatient Medications (Respiratory):    benzonatate (TESSALON) 100 MG capsule, Take 1 capsule (100 mg total) by mouth every 8 (eight) hours.    Current Outpatient Medications (Other):    ALPRAZolam (XANAX) 1 MG tablet, Take 1 tablet (1 mg total) by mouth at bedtime as needed for anxiety.   dicyclomine (BENTYL) 20 MG tablet, Take 1 tablet (20 mg total) by mouth 2 (two) times daily.   nirmatrelvir/ritonavir EUA (PAXLOVID) 20 x 150 MG & 10 x '100MG'$  TABS, Take 3 tablets by mouth 2 (two) times daily for 5 days. Patient GFR is >60 Take nirmatrelvir (150 mg) two tablets twice daily for 5 days and ritonavir (100 mg) one tablet twice daily for 5 days.   ondansetron (ZOFRAN-ODT) 4 MG disintegrating  tablet, Take 1 tablet (4 mg total) by mouth every 8 (eight) hours as needed for nausea or vomiting.  Allergies:  Allergies  Allergen Reactions   No Known Allergies    Other     Stitches used after growth on back removed, caused redness and itching    Problem list She has DIARRHEA; Family history of colonic polyps; Hyperlipidemia; GERD (gastroesophageal reflux disease); Vitamin D deficiency; Tobacco use disorder; Other abnormal glucose; and Obesity on their problem list.   Social History:   reports that she has been smoking cigarettes. She has a 8.50 pack-year smoking history. She has never used smokeless tobacco. She reports current alcohol use. She reports that she does not use drugs.  Observations/Objective:  General : Well sounding patient in no apparent distress HEENT: no hoarseness, no cough for duration of visit Lungs: speaks in complete sentences, no audible wheezing, no apparent distress Neurological: alert, oriented x 3 Psychiatric: pleasant, judgement appropriate   Assessment and Plan:  Covid 19 Covid 19 positive per rapid screening test in ED Risk factors observed  Symptoms are: moderate Take tylenol PRN temp 101+ Push hydration Sx supportive therapy suggested Follow up via mychart or telephone if needed Advised patient obtain O2 monitor; present to ED if persistently <90% or with severe dyspnea, CP, fever uncontrolled by tylenol, confusion, sudden decline Should remain in isolation until at least 5 days from onset of sx, 24-48 hours fever free without tylenol, sx such as cough are improved.  Follow Up Instructions:  I discussed the assessment and treatment plan with the patient. The patient was provided an opportunity to ask questions and all were answered. The patient agreed with the plan and demonstrated an understanding of the instructions.   The patient was advised to call back or seek an in-person evaluation if the symptoms worsen or if the condition  fails to improve as anticipated.  COVID-19 - azithromycin (ZITHROMAX) 500 MG tablet; Take 1 tablet (500 mg total) by mouth daily for 10 days. Take 1 tablet daily for 3 days.  Dispense: 10 tablet; Refill: 0  Chest congestion Push fluids  Report to ER for any difficulty breathing.  Nausea Promethazine (PHENERGAN) 12.5 MG tablet; Take 1 tablet (12.5 mg total) by mouth every 6 (six) hours as needed for nausea or vomiting.  Dispense: 7 tablet; Refill: 0  Diarrhea of presumed infectious origin Stay well hydrated  Other fatigue Rest  Loss of taste Continue to monitor   I provided 15 minutes of non-face-to-face time during this encounter.   Darrol Jump, NP

## 2022-07-11 ENCOUNTER — Emergency Department (HOSPITAL_BASED_OUTPATIENT_CLINIC_OR_DEPARTMENT_OTHER): Payer: 59

## 2022-07-11 ENCOUNTER — Other Ambulatory Visit: Payer: Self-pay

## 2022-07-11 ENCOUNTER — Emergency Department (HOSPITAL_BASED_OUTPATIENT_CLINIC_OR_DEPARTMENT_OTHER)
Admission: EM | Admit: 2022-07-11 | Discharge: 2022-07-11 | Disposition: A | Discharge status: Home / Self Care | Payer: 59 | Attending: Emergency Medicine | Admitting: Emergency Medicine

## 2022-07-11 DIAGNOSIS — R1010 Upper abdominal pain, unspecified: Secondary | ICD-10-CM | POA: Insufficient documentation

## 2022-07-11 DIAGNOSIS — R112 Nausea with vomiting, unspecified: Secondary | ICD-10-CM | POA: Diagnosis present

## 2022-07-11 DIAGNOSIS — Z79899 Other long term (current) drug therapy: Secondary | ICD-10-CM | POA: Diagnosis not present

## 2022-07-11 LAB — BASIC METABOLIC PANEL
Anion gap: 10 (ref 5–15)
BUN: 16 mg/dL (ref 6–20)
CO2: 22 mmol/L (ref 22–32)
Calcium: 9 mg/dL (ref 8.9–10.3)
Chloride: 108 mmol/L (ref 98–111)
Creatinine, Ser: 0.57 mg/dL (ref 0.44–1.00)
GFR, Estimated: >60 mL/min (ref >60)
Glucose, Bld: 110 mg/dL — ABNORMAL HIGH (ref 70–99)
Potassium: 3.4 mmol/L — ABNORMAL LOW (ref 3.5–5.1)
Sodium: 140 mmol/L (ref 135–145)

## 2022-07-11 LAB — CBC
HCT: 41.8 % (ref 36.0–46.0)
Hemoglobin: 14.9 g/dL (ref 12.0–15.0)
MCH: 32.3 pg (ref 26.0–34.0)
MCHC: 35.6 g/dL (ref 30.0–36.0)
MCV: 90.5 fL (ref 80.0–100.0)
Platelets: 319 10*3/uL (ref 150–400)
RBC: 4.62 MIL/uL (ref 3.87–5.11)
RDW: 12.8 % (ref 11.5–15.5)
WBC: 8.5 10*3/uL (ref 4.0–10.5)
nRBC: 0 % (ref 0.0–0.2)

## 2022-07-11 LAB — TROPONIN I (HIGH SENSITIVITY): Troponin I (High Sensitivity): 2 ng/L (ref <18)

## 2022-07-11 MED ORDER — SUCRALFATE 1 G PO TABS: 1.0000 g | ORAL_TABLET | Freq: Three times a day (TID) | ORAL | 0 refills | Status: DC

## 2022-07-11 MED ORDER — ONDANSETRON 4 MG PO TBDP
4.0000 mg | ORAL_TABLET | Freq: Once | ORAL | Status: AC
  Administered 2022-07-11: 4 mg via ORAL
  Filled 2022-07-11: qty 1

## 2022-07-11 MED ORDER — PANTOPRAZOLE SODIUM 20 MG PO TBEC: 20.0000 mg | DELAYED_RELEASE_TABLET | Freq: Every day | ORAL | 0 refills | Status: DC

## 2022-07-11 NOTE — ED Provider Notes (Signed)
[facility]ENC[facility] DEPART[facility] Provider Note   CSN: [id number] Arrival date & time: 07/11/22  1845     History  Chief Complaint  Patient presents with   Emesis    Laura Kelley is a 56 y.o. female.  Patient here with chest pain, upper abdominal discomfort, nausea.  She has been dealing with nausea and vomiting and discomfort since being diagnosed with COVID a few days ago.  Phenergan and Zofran have helped.  Zofran does not help as much with Phenergan makes her too sleepy.  She has been having some chest discomfort.  She denies any shortness of breath, weakness, chills.  She has been on Paxlovid.  Denies any black or bloody stools.  The history is provided by the patient.       Home Medications Prior to Admission medications   Medication Sig Start Date End Date Taking? Authorizing Provider  pantoprazole (PROTONIX) 20 MG tablet Take 1 tablet (20 mg total) by mouth daily. 07/11/22 08/10/22 Yes Melani Brisbane, DO  sucralfate (CARAFATE) 1 g tablet Take 1 tablet (1 g total) by mouth 4 (four) times daily -  with meals and at bedtime for 14 days. 07/11/22 07/25/22 Yes Endora Teresi, DO  ALPRAZolam Duanne Moron) 1 MG tablet Take 1 tablet (1 mg total) by mouth at bedtime as needed for anxiety. 11/20/18   Vladimir Crofts, PA-C  azithromycin (ZITHROMAX) 500 MG tablet Take 1 tablet (500 mg total) by mouth daily for 10 days. Take 1 tablet daily for 3 days. 07/10/22 07/20/22  Darrol Jump, NP  benzonatate (TESSALON) 100 MG capsule Take 1 capsule (100 mg total) by mouth every 8 (eight) hours. 07/07/22   Suzy Bouchard, PA-C  dicyclomine (BENTYL) 20 MG tablet Take 1 tablet (20 mg total) by mouth 2 (two) times daily. 07/07/22   Suzy Bouchard, PA-C  nirmatrelvir/ritonavir EUA (PAXLOVID) 20 x 150 MG & 10 x '100MG'$  TABS Take 3 tablets by mouth 2 (two) times daily for 5 days. Patient GFR is >60 Take nirmatrelvir (150 mg) two tablets twice daily for 5 days and ritonavir (100 mg) one  tablet twice daily for 5 days. 07/07/22 07/12/22  Suzy Bouchard, PA-C  ondansetron (ZOFRAN-ODT) 4 MG disintegrating tablet Take 1 tablet (4 mg total) by mouth every 8 (eight) hours as needed for nausea or vomiting. 07/07/22   Karel Jarvis, Druscilla Brownie, PA-C  promethazine (PHENERGAN) 12.5 MG tablet Take 1 tablet (12.5 mg total) by mouth every 6 (six) hours as needed for nausea or vomiting. 07/10/22   Darrol Jump, NP      Allergies    No known allergies and Other    Review of Systems   Review of Systems  Physical Exam Updated Vital Signs BP 123/78   Pulse 77   Temp 98.2 F (36.8 C) (Oral)   Resp 19   Ht '5\' 1"'$  (1.549 m)   Wt 70.3 kg   LMP 07/24/2014   SpO2 98%   BMI 29.29 kg/m  Physical Exam Vitals and nursing note reviewed.  Constitutional:      General: She is not in acute distress.    Appearance: She is well-developed. She is not ill-appearing.  HENT:     Head: Normocephalic and atraumatic.     Nose: Nose normal.     Mouth/Throat:     Mouth: Mucous membranes are moist.  Eyes:     Extraocular Movements: Extraocular movements intact.     Conjunctiva/sclera: Conjunctivae normal.     Pupils: Pupils  are equal, round, and reactive to light.  Cardiovascular:     Rate and Rhythm: Normal rate and regular rhythm.     Pulses: Normal pulses.     Heart sounds: Normal heart sounds. No murmur heard. Pulmonary:     Effort: Pulmonary effort is normal. No respiratory distress.     Breath sounds: Normal breath sounds.  Abdominal:     General: Abdomen is flat. There is no distension.     Palpations: Abdomen is soft.     Tenderness: There is no abdominal tenderness.  Musculoskeletal:        General: No swelling.     Cervical back: Normal range of motion and neck supple.  Skin:    General: Skin is warm and dry.     Capillary Refill: Capillary refill takes less than 2 seconds.  Neurological:     Mental Status: She is alert.  Psychiatric:        Mood and Affect: Mood normal.      ED Results / Procedures / Treatments   Labs (all labs ordered are listed, but only abnormal results are displayed) Labs Reviewed  BASIC METABOLIC PANEL - Abnormal; Notable for the following components:      Result Value   Potassium 3.4 (*)    Glucose, Bld 110 (*)    All other components within normal limits  CBC  TROPONIN I (HIGH SENSITIVITY)    EKG EKG Interpretation  Date/Time:  Thursday July 11 2022 19:12:51 EDT Ventricular Rate:  66 PR Interval:  172 QRS Duration: 82 QT Interval:  420 QTC Calculation: 440 R Axis:   35 Text Interpretation: Normal sinus rhythm No previous ECGs available Confirmed by Lennice Sites ([id number]) on 07/11/2022 8:35:14 PM  Radiology DG Chest Port 1 View  Result Date: 07/11/2022 CLINICAL DATA:  Chest pain coughing; COVID positive EXAM: PORTABLE CHEST 1 VIEW COMPARISON:  Radiographs 08/31/2015 FINDINGS: Normal cardiomediastinal silhouette. Aortic atherosclerotic calcification. Left basilar atelectasis no focal consolidation, pleural effusion, or pneumothorax. No acute osseous abnormality IMPRESSION: No active disease. Electronically Signed   By: Placido Sou M.D.   On: 07/11/2022 20:16    Procedures Procedures    Medications Ordered in ED Medications  ondansetron (ZOFRAN-ODT) disintegrating tablet 4 mg (has no administration in time range)    ED Course/ Medical Decision Making/ A&P                           Medical Decision Making Amount and/or Complexity of Data Reviewed Labs: ordered. Radiology: ordered.  Risk Prescription drug management.   Laura Kelley is here with chest pain and nausea.  Symptoms have started since being diagnosed with COVID.  She has no abdominal tenderness on exam.  She is well-appearing.  She has Zofran and Phenergan at home which is help with her nausea and vomiting.  She is having some chest discomfort but no shortness of breath or cough or sputum production.  Clear breath sounds.  Differential  diagnosis is likely ongoing COVID/may be reflux related pain from her nausea and vomiting.  I have no suspicion for PE or ACS but will get chest x-ray, EKG, CBC, BMP and troponin.  She has no respiratory symptoms.  No hypoxia no tachycardia no concern for PE at this time.  Troponin is normal.  EKG per my review and interpretation shows sinus rhythm.  No ischemic changes.  There is no significant anemia, electrolyte abnormality, kidney injury.  Overall suspect that her  symptoms are GI related.  We will start her on Protonix and Carafate.  We will have her stop Paxlovid in case that made symptoms worse.  Discharged in good condition.  This chart was dictated using voice recognition software.  Despite best efforts to proofread,  errors can occur which can change the documentation meaning.         Final Clinical Impression(s) / ED Diagnoses Final diagnoses:  Nausea and vomiting, unspecified vomiting type    Rx / DC Orders ED Discharge Orders          Ordered    pantoprazole (PROTONIX) 20 MG tablet  Daily        07/11/22 2101    sucralfate (CARAFATE) 1 g tablet  3 times daily with meals & bedtime        07/11/22 2101              Lennice Sites, DO 07/11/22 2105

## 2022-07-11 NOTE — ED Triage Notes (Addendum)
Pt to ED C/O chest pain, intermittent since this morning. Coughing. Reports confirmed COVID positive. Also c/o generalized weakness and abdominal pain.

## 2022-07-11 NOTE — Discharge Instructions (Signed)
Continue your Zofran and Phenergan 1 or the other every 8 hours as needed.  I would discontinue your Paxlovid as I suspect this could be making her symptoms worse.  You can pick up medicine tomorrow for possible stomach inflammation with Protonix and Carafate.

## 2022-07-16 ENCOUNTER — Emergency Department (HOSPITAL_BASED_OUTPATIENT_CLINIC_OR_DEPARTMENT_OTHER)
Admission: EM | Admit: 2022-07-16 | Discharge: 2022-07-16 | Disposition: A | Discharge status: Home / Self Care | Payer: 59 | Attending: Emergency Medicine | Admitting: Emergency Medicine

## 2022-07-16 ENCOUNTER — Ambulatory Visit: Payer: 59 | Admitting: Nurse Practitioner

## 2022-07-16 ENCOUNTER — Encounter: Payer: Self-pay | Admitting: Nurse Practitioner

## 2022-07-16 ENCOUNTER — Other Ambulatory Visit: Payer: Self-pay

## 2022-07-16 VITALS — BP 110/62 | HR 70 | Temp 97.7°F | Ht 61.0 in | Wt 151.2 lb

## 2022-07-16 DIAGNOSIS — M79662 Pain in left lower leg: Secondary | ICD-10-CM | POA: Diagnosis not present

## 2022-07-16 DIAGNOSIS — E663 Overweight: Secondary | ICD-10-CM

## 2022-07-16 DIAGNOSIS — M79605 Pain in left leg: Secondary | ICD-10-CM | POA: Diagnosis present

## 2022-07-16 DIAGNOSIS — F17200 Nicotine dependence, unspecified, uncomplicated: Secondary | ICD-10-CM

## 2022-07-16 NOTE — ED Notes (Signed)
Pt dc'd home with the plan to return tomorrow morning at 0600 for [place]

## 2022-07-16 NOTE — Patient Instructions (Signed)
Deep Vein Thrombosis  Deep vein thrombosis (DVT) is a condition in which a blood clot forms in a vein of the deep venous system. This can occur in the lower leg, thigh, pelvis, arm, or neck. A clot is blood that has thickened into a gel or solid. This condition is serious and can be life-threatening if the clot travels to the arteries of the lungs and causes a blockage (pulmonary embolism). A DVT can also damage veins in the leg, which can lead to long-term venous disease, leg pain, swelling, discoloration, and ulcers or sores (post-thrombotic syndrome). What are the causes? This condition may be caused by: A slowdown of blood flow. Damage to a vein. A condition that causes blood to clot more easily, such as certain bleeding disorders. What increases the risk? The following factors may make you more likely to develop this condition: Obesity. Being older, especially older than age 3. Being inactive or not moving around (sedentary lifestyle). This may include: Sitting or lying down for longer than 4-6 hours other than to sleep at night. Being in the hospital, or having major or lengthy surgery. Having any recent bone injuries, such as breaks (fractures), that reduce movement, especially in the lower extremities. Having recent orthopedic surgery on the lower extremities. Being pregnant, giving birth, or having recently given birth. Taking medicines that contain estrogen, such as birth control or hormone replacement therapy. Using products that contain nicotine or tobacco, especially if you use hormonal birth control. Having a history of a blood vessel disease (peripheral vascular disease) or congestive heart disease. Having a history of cancer, especially if being treated with chemotherapy. What are the signs or symptoms? Symptoms of this condition include: Swelling, pain, pressure, or tenderness in an arm or a leg. An arm or a leg becoming warm, red, or discolored. A leg turning very pale or  blue. You may have a large DVT. This is rare. If the clot is in your leg, you may notice that symptoms get worse when you stand or walk. In some cases, there are no symptoms. How is this diagnosed? This condition is diagnosed with: Your medical history and a physical exam. Tests, such as: Blood tests to check how well your blood clots. Doppler ultrasound. This is the best way to find a DVT. CT venogram. Contrast dye is injected into a vein, and X-rays are taken to check for clots. This is helpful for veins in the chest or pelvis. How is this treated? Treatment for this condition depends on: The cause of your DVT. The size and location of your DVT, or having more than one DVT. Your risk for bleeding or developing more clots. Other medical conditions you may have. Treatment may include: Taking a blood thinner medicine (anticoagulant) to prevent more clots from forming or current clots from growing. Wearing compression stockings. Injecting medicines into the affected vein to break up the clot (catheter-directed thrombolysis). Surgical procedures, when DVT is severe or hard to treat. These may be done to: Isolate and remove your clot. Place an inferior vena cava (IVC) filter. This filter is placed into a large vein called the inferior vena cava to catch blood clots before they reach your lungs. You may get some medical treatments for 6 months or longer. Follow these instructions at home: If you are taking blood thinners: Talk with your health care provider before you take any medicines that contain aspirin or NSAIDs, such as ibuprofen. These medicines increase your risk for dangerous bleeding. Take your medicine exactly  as told, at the same time every day. Do not skip a dose. Do not take more than the prescribed dose. This is important. Ask your health care provider about foods and medicines that could change or interact with the way your blood thinner works. Avoid these foods and medicines  if you are told to do so. Avoid anything that may cause bleeding or bruising. You may bleed more easily while taking blood thinners. Be very careful when using knives, scissors, or other sharp objects. Use an electric razor instead of a blade. Avoid activities that could cause injury or bruising, and follow instructions for preventing falls. Tell your health care provider if you have had any internal bleeding, bleeding ulcers, or neurologic diseases, such as strokes or cerebral aneurysms. Wear a medical alert bracelet or carry a card that lists what medicines you take. General instructions Take over-the-counter and prescription medicines only as told by your health care provider. Return to your normal activities as told by your health care provider. Ask your health care provider what activities are safe for you. If recommended, wear compression stockings as told by your health care provider. These stockings help to prevent blood clots and reduce swelling in your legs. Never wear your compression stockings while sleeping at night. Keep all follow-up visits. This is important. Where to find more information American Heart Association: www.heart.org Centers for Disease Control and Prevention: http://www.wolf.info/ National Heart, Lung, and Blood Institute: https://wilson-eaton.com/ Contact a health care provider if: You miss a dose of your blood thinner. You have unusual bruising or other color changes. You have new or worse pain, swelling, or redness in an arm or a leg. You have worsening numbness or tingling in an arm or a leg. You have a significant color change (pale or blue) in the extremity that has the DVT. Get help right away if: You have signs or symptoms that a blood clot has moved to the lungs. These may include: Shortness of breath. Chest pain. Fast or irregular heartbeats (palpitations). Light-headedness, dizziness, or fainting. Coughing up blood. You have signs or symptoms that your blood is  too thin. These may include: Blood in your vomit, stool, or urine. A cut that will not stop bleeding. A menstrual period that is heavier than usual. A severe headache or confusion. These symptoms may be an emergency. Get help right away. Call 911. Do not wait to see if the symptoms will go away. Do not drive yourself to the hospital. Summary Deep vein thrombosis (DVT) happens when a blood clot forms in a deep vein. This may occur in the lower leg, thigh, pelvis, arm, or neck. Symptoms affect the arm or leg and can include swelling, pain, tenderness, warmth, redness, or discoloration. This condition may be treated with medicines. In severe cases, a procedure or surgery may be done to remove or dissolve the clots. If you are taking blood thinners, take them exactly as told. Do not skip a dose. Do not take more than is prescribed. Get help right away if you have a severe headache, shortness of breath, chest pain, fast or irregular heartbeats, or blood in your vomit, urine, or stool. This information is not intended to replace advice given to you by your health care provider. Make sure you discuss any questions you have with your health care provider. Document Revised: 04/23/2021 Document Reviewed: 04/23/2021 Elsevier Patient Education  [place].

## 2022-07-16 NOTE — ED Provider Notes (Signed)
[facility] DEPT Provider Note   CSN: [id number] Arrival date & time: 07/16/22  1628     History  Chief Complaint  Patient presents with   Leg Pain    Laura Kelley is a 56 y.o. female.  Patient complains of pain and swelling.   The history is provided by the patient. No language interpreter was used.  Leg Pain Location:  Leg Injury: no   Leg location:  L leg Pain details:    Quality:  Aching   Radiates to:  Does not radiate   Severity:  Moderate   Onset quality:  Gradual   Timing:  Constant   Progression:  Worsening Chronicity:  New Dislocation: no        Home Medications Prior to Admission medications   Medication Sig Start Date End Date Taking? Authorizing Provider  ondansetron (ZOFRAN-ODT) 4 MG disintegrating tablet Take 1 tablet (4 mg total) by mouth every 8 (eight) hours as needed for nausea or vomiting. 07/07/22   Karel Jarvis, Druscilla Brownie, PA-C  promethazine (PHENERGAN) 12.5 MG tablet Take 1 tablet (12.5 mg total) by mouth every 6 (six) hours as needed for nausea or vomiting. 07/10/22   Darrol Jump, NP      Allergies    No known allergies and Other    Review of Systems   Review of Systems  All other systems reviewed and are negative.   Physical Exam Updated Vital Signs BP 102/85 (BP Location: Left Arm)   Pulse 64   Temp 98.3 F (36.8 C) (Oral)   Resp 18   Ht 5' (1.524 m)   Wt 68 kg   LMP 07/24/2014   SpO2 99%   BMI 29.29 kg/m  Physical Exam Vitals reviewed.  Constitutional:      Appearance: Normal appearance.  Cardiovascular:     Rate and Rhythm: Normal rate.  Pulmonary:     Effort: Pulmonary effort is normal.  Musculoskeletal:        General: No swelling or tenderness.     Cervical back: Normal range of motion.  Skin:    General: Skin is warm.  Neurological:     General: No focal deficit present.     Mental Status: She is alert.  Psychiatric:        Mood and Affect: Mood normal.     ED Results /  Procedures / Treatments   Labs (all labs ordered are listed, but only abnormal results are displayed) Labs Reviewed - No data to display  EKG None  Radiology No results found.  Procedures Procedures    Medications Ordered in ED Medications - No data to display  ED Course/ Medical Decision Making/ A&P                           Medical Decision Making Pt reports her Primary care MD sent her here for an ultrasound of her leg.  Pt has covid and reports she has pain in her left leg, Pt's doctor started her on eliquis    Amount and/or Complexity of Data Reviewed Radiology:     Details: Ultrasound ordered in am   Risk Risk Details: Pt advised to take eliquis as directed by primary             Final Clinical Impression(s) / ED Diagnoses Final diagnoses:  Left leg pain    Rx / DC Orders ED Discharge Orders     None  An After Visit Summary was printed and given to the patient.    Fransico Meadow, PA-C 07/16/22 1735    Malvin Johns, MD 07/16/22 2213

## 2022-07-16 NOTE — Progress Notes (Signed)
Assessment and Plan:  Laura Kelley was seen today for leg pain.  Diagnoses and all orders for this visit:  Pain of left calf Will get stat U/S to rule out clot and treat appropriately Start on Eliquis 5 mg BID prophylaxis until results return If develops worsening of pain, difficulty breathing or chest pain she is to go to the ER They can not get her in for U/S until 2 days from now and has decided to go to the ER.  -     VAS [place] LOWER EXTREMITY VENOUS (DVT); Future  Overweight Long discussion about weight loss, diet, and exercise Recommended diet heavy in fruits and veggies and low in animal meats, cheeses, and dairy products, appropriate calorie intake Patient will work on decreasing saturated fats and simple carbs Follow up at next visit  Tobacco use disorder Has stopped for approximately 1 month Encouraged to continue to avoid cigarettes      Further disposition pending results of labs. Discussed med's effects and SE's.   Over 30 minutes of exam, counseling, chart review, and critical decision making was performed.   Future Appointments  Date Time Provider [place]  08/05/2022  3:00 PM Darrol Jump, NP [facility] None    ------------------------------------------------------------------------------------------------------------------   HPI BP 110/62   Pulse 70   Temp 97.7 F (36.5 C)   Ht '5\' 1"'$  (1.549 m)   Wt 151 lb 3.2 oz (68.6 kg)   LMP 07/24/2014   SpO2 96%   BMI 28.57 kg/m   56 y.o.female presents for pain in legs since Covid 07/07/22. Beginning yesterday she started having pain in back of left calf that travels up the back of her leg.  No swelling or heat.  Does cause her to limp slightly due to pain. Pain is worse with walking and flexing her foot.  Nausea /vomiting have resolved.  Has been eating regularly  Denies shortness of breath/wheezing.   Has not smoked in over a month.   BMI is Body mass index is 28.57 kg/m., she has not been working on  diet and exercise. Wt Readings from Last 3 Encounters:  07/16/22 151 lb 3.2 oz (68.6 kg)  07/11/22 155 lb (70.3 kg)  07/07/22 155 lb (70.3 kg)     Past Medical History:  Diagnosis Date   Anxiety    GERD (gastroesophageal reflux disease)    occ s/s   History of duodenal ulcer 06/03/2012   + Hpylori treated 2013    Hyperlipidemia    Ulcer    Vitamin D deficiency      Allergies  Allergen Reactions   No Known Allergies    Other     Stitches used after growth on back removed, caused redness and itching    Current Outpatient Medications on File Prior to Visit  Medication Sig   ondansetron (ZOFRAN-ODT) 4 MG disintegrating tablet Take 1 tablet (4 mg total) by mouth every 8 (eight) hours as needed for nausea or vomiting.   promethazine (PHENERGAN) 12.5 MG tablet Take 1 tablet (12.5 mg total) by mouth every 6 (six) hours as needed for nausea or vomiting.   ALPRAZolam (XANAX) 1 MG tablet Take 1 tablet (1 mg total) by mouth at bedtime as needed for anxiety. (Patient not taking: Reported on 07/16/2022)   azithromycin (ZITHROMAX) 500 MG tablet Take 1 tablet (500 mg total) by mouth daily for 10 days. Take 1 tablet daily for 3 days. (Patient not taking: Reported on 07/16/2022)   benzonatate (TESSALON) 100 MG capsule Take 1  capsule (100 mg total) by mouth every 8 (eight) hours. (Patient not taking: Reported on 07/16/2022)   dicyclomine (BENTYL) 20 MG tablet Take 1 tablet (20 mg total) by mouth 2 (two) times daily. (Patient not taking: Reported on 07/16/2022)   pantoprazole (PROTONIX) 20 MG tablet Take 1 tablet (20 mg total) by mouth daily. (Patient not taking: Reported on 07/16/2022)   sucralfate (CARAFATE) 1 g tablet Take 1 tablet (1 g total) by mouth 4 (four) times daily -  with meals and at bedtime for 14 days. (Patient not taking: Reported on 07/16/2022)   No current facility-administered medications on file prior to visit.    ROS: all negative except above.   Physical Exam:  BP 110/62    Pulse 70   Temp 97.7 F (36.5 C)   Ht '5\' 1"'$  (1.549 m)   Wt 151 lb 3.2 oz (68.6 kg)   LMP 07/24/2014   SpO2 96%   BMI 28.57 kg/m   General Appearance: Well nourished, in no apparent distress. Eyes: PERRLA, EOMs, conjunctiva no swelling or erythema Sinuses: No Frontal/maxillary tenderness ENT/Mouth: Ext aud canals clear, TMs without erythema, bulging. No erythema, swelling, or exudate on post pharynx.  Tonsils not swollen or erythematous. Hearing normal.  Neck: Supple, thyroid normal.  Respiratory: Respiratory effort normal, BS equal bilaterally without rales, rhonchi, wheezing or stridor.  Cardio: RRR with no MRGs. Brisk peripheral pulses without edema.  Abdomen: Soft, + BS.  Non tender, no guarding, rebound, hernias, masses. Lymphatics: Non tender without lymphadenopathy.  Musculoskeletal: Full ROM, 5/5 strength. Positive left calf pain with pressure and flexion, no warmth or redness. No difference in calf size bilaterally Skin: Warm, dry without rashes, lesions, ecchymosis.  Neuro: Cranial nerves intact. Normal muscle tone, no cerebellar symptoms. Sensation intact.  Psych: Awake and oriented X 3, normal affect, Insight and Judgment appropriate.     Alycia Rossetti, NP 3:19 PM [facility]

## 2022-07-16 NOTE — ED Triage Notes (Signed)
Pt arrived POV, caox4, ambulatory. Pt reports she was COVID+ 2 weeks ago and began having pain in her left calf yesterday and is concerned that she may have a blood clot so she saw her PCP today who told her to further go to the ED. No swelling, denies injury.

## 2022-07-16 NOTE — Discharge Instructions (Addendum)
Return here in the am for ultrasound of your leg.  Take the blood thinner as prescribed

## 2022-07-16 NOTE — ED Notes (Signed)
EDPA Santiago Glad to triage to evaluate patient.

## 2022-07-16 NOTE — ED Notes (Addendum)
Pt willing to wait and speak with EDP provider

## 2022-07-17 ENCOUNTER — Ambulatory Visit (HOSPITAL_BASED_OUTPATIENT_CLINIC_OR_DEPARTMENT_OTHER)
Admission: RE | Admit: 2022-07-17 | Discharge: 2022-07-17 | Disposition: A | Discharge status: Home / Self Care | Payer: 59 | Source: Ambulatory Visit | Attending: Emergency Medicine | Admitting: Emergency Medicine

## 2022-07-17 ENCOUNTER — Telehealth (HOSPITAL_BASED_OUTPATIENT_CLINIC_OR_DEPARTMENT_OTHER): Payer: Self-pay | Admitting: Emergency Medicine

## 2022-07-17 ENCOUNTER — Ambulatory Visit (HOSPITAL_BASED_OUTPATIENT_CLINIC_OR_DEPARTMENT_OTHER): Admission: RE | Admit: 2022-07-17 | Payer: 59 | Source: Ambulatory Visit

## 2022-07-17 ENCOUNTER — Other Ambulatory Visit (HOSPITAL_BASED_OUTPATIENT_CLINIC_OR_DEPARTMENT_OTHER): Payer: Self-pay | Admitting: Radiology

## 2022-07-17 ENCOUNTER — Ambulatory Visit (HOSPITAL_BASED_OUTPATIENT_CLINIC_OR_DEPARTMENT_OTHER): Payer: 59

## 2022-07-17 ENCOUNTER — Other Ambulatory Visit (HOSPITAL_BASED_OUTPATIENT_CLINIC_OR_DEPARTMENT_OTHER): Payer: Self-pay | Admitting: Physician Assistant

## 2022-07-17 DIAGNOSIS — M79605 Pain in left leg: Secondary | ICD-10-CM | POA: Diagnosis present

## 2022-07-17 DIAGNOSIS — Z8616 Personal history of COVID-19: Secondary | ICD-10-CM | POA: Insufficient documentation

## 2022-07-17 NOTE — ED Provider Notes (Signed)
I have reviewed the ultrasound read from radiology service.  No evidence of DVT.  Patient is outside the ED, awaiting results.  Nursing staff will convey the results.   Varney Biles, MD 07/17/22 [phone]

## 2022-07-17 NOTE — ED Notes (Signed)
Pt given washcloth and ice chips and updated her that her [place] was negative after double checking with Dr. Kathrynn Humble.  Pt is in no distress and left ambulatory

## 2022-07-17 NOTE — Telephone Encounter (Signed)
Patient to return for [place] from visit yesterday afternoon, [place] tech reports she needs an outpatient order (ED order was cancelled at discharge). Per notes from yesterday, planning doppler of LLE to rule out DVT.

## 2022-07-18 ENCOUNTER — Ambulatory Visit (HOSPITAL_COMMUNITY): Payer: 59

## 2022-08-05 ENCOUNTER — Encounter: Payer: 59 | Admitting: Nurse Practitioner

## 2022-08-05 NOTE — Progress Notes (Deleted)
Complete Physical  Assessment and Plan:     Discussed med's effects and SE's. Screening labs and tests as requested with regular follow-up as recommended. Over 40 minutes of exam, counseling, chart review, and complex, high level critical decision making was performed this visit.   Future Appointments  Date Time Provider [place]  08/05/2022  3:00 PM Darrol Jump, NP [facility] None  08/21/2022  3:00 PM Darrol Jump, NP [facility] None  08/06/2023  3:00 PM Darrol Jump, NP [facility] None    HPI  56 y.o. female  presents for a complete physical. She has DIARRHEA; Family history of colonic polyps; Hyperlipidemia; GERD (gastroesophageal reflux disease); Vitamin D deficiency; Tobacco use disorder; Other abnormal glucose; and Obesity on their problem list.  Overall she reports feeling well today.  She had Covid 07/11/22 and visited the ER d/t nausea.     BMI is There is no height or weight on file to calculate BMI., she {HAS HAS GHW:29937} been working on diet and exercise. Wt Readings from Last 3 Encounters:  07/16/22 150 lb (68 kg)  07/16/22 151 lb 3.2 oz (68.6 kg)  07/11/22 155 lb (70.3 kg)   Her blood pressure {HAS HAS NOT:18834} been controlled at home, today their BP is   She {DOES_DOES JIR:67893} workout. She denies chest pain, shortness of breath, dizziness.   She {ACTION; IS/IS YBO:17510258} on cholesterol medication and denies myalgias. Her cholesterol {ACTION; IS/IS NOT:21021397} at goal. The cholesterol last visit was:   Lab Results  Component Value Date   CHOL 224 (H) 12/19/2017   HDL 53 12/19/2017   LDLCALC 152 (H) 12/19/2017   TRIG 88 12/19/2017   CHOLHDL 4.2 12/19/2017   She {Has/has not:18111} been working on diet and exercise for ***, she {ACTION; IS/IS NOT:21021397} on bASA, she {ACTION; IS/IS NOT:21021397} on ACE/ARB and denies {Symptoms; diabetes w/o none:19199}. Last A1C in the office was:  Lab Results  Component Value Date   HGBA1C 5.3  09/03/2016   Last GFR: No results found for: "EGFR" Patient is on Vitamin D supplement.   Lab Results  Component Value Date   VD25OH 24 (L) 12/19/2017      Current Medications:  Current Outpatient Medications on File Prior to Visit  Medication Sig Dispense Refill   ondansetron (ZOFRAN-ODT) 4 MG disintegrating tablet Take 1 tablet (4 mg total) by mouth every 8 (eight) hours as needed for nausea or vomiting. 20 tablet 0   promethazine (PHENERGAN) 12.5 MG tablet Take 1 tablet (12.5 mg total) by mouth every 6 (six) hours as needed for nausea or vomiting. 7 tablet 0   No current facility-administered medications on file prior to visit.   Allergies:  Allergies  Allergen Reactions   No Known Allergies    Other     Stitches used after growth on back removed, caused redness and itching   Medical History:  She has DIARRHEA; Family history of colonic polyps; Hyperlipidemia; GERD (gastroesophageal reflux disease); Vitamin D deficiency; Tobacco use disorder; Other abnormal glucose; and Obesity on their problem list.  Health Maintenance:   Immunization History  Administered Date(s) Administered   Tdap 07/29/2011   Health Maintenance  Topic Date Due   COVID-19 Vaccine (1) Never done   Zoster Vaccines- Shingrix (1 of 2) Never done   MAMMOGRAM  10/25/2016   PAP SMEAR-Modifier  11/18/2016   TETANUS/TDAP  07/28/2021   INFLUENZA VACCINE  Never done   COLONOSCOPY (Pts 45-14yr Insurance coverage will need to be confirmed)  01/30/2026   Hepatitis  C Screening  Completed   HIV Screening  Completed   HPV VACCINES  Aged Out    LMP: Patient's last menstrual period was 07/24/2014. Sexually Active: {YES NO:22349} STD testing offered Pap:  2015 recommended every 3 years MGM: 2006 DEXA: Not due   Colonoscopy: 01/2021 Recall Q5 years Due 2027 EGD:  Last Dental Exam: *** Last Eye Exam: *** Last Derm Exam: ***  Patient Care Team: Unk Pinto, MD as PCP - General (Internal  Medicine) Ladene Artist, MD as Consulting Physician (Gastroenterology) Bobbye Charleston, MD as Consulting Physician (Obstetrics and Gynecology)  Surgical History:  She has a past surgical history that includes Lumbar disc surgery; Cesarean section (1985, 1993); Esophagogastroduodenoscopy (06/03/2012); Colonoscopy (2013); and Upper gastrointestinal endoscopy. Family History:  Herfamily history includes Colon polyps in her mother; Hemochromatosis in her brother; Multiple sclerosis in her mother. Social History:  She reports that she has been smoking cigarettes. She has a 8.50 pack-year smoking history. She has never used smokeless tobacco. She reports current alcohol use. She reports that she does not use drugs.  Review of Systems: ROS  Physical Exam: Estimated body mass index is 29.29 kg/m as calculated from the following:   Height as of 07/16/22: 5' (1.524 m).   Weight as of 07/16/22: 150 lb (68 kg). LMP 07/24/2014   General Appearance: Well nourished, well developed, in no apparent distress.  Eyes: PERRLA, EOMs, conjunctiva no swelling or erythema, normal fundi and vessels.  Sinuses: No Frontal/maxillary tenderness  ENT/Mouth: Ext aud canals clear, normal light reflex with TMs without erythema, bulging. Good dentition. No erythema, swelling, or exudate on post pharynx. Tonsils not swollen or erythematous. Hearing normal.  Neck: Supple, thyroid normal. No bruits  Respiratory: Respiratory effort normal, BS equal bilaterally without rales, rhonchi, wheezing or stridor.  Cardio: RRR without murmurs, rubs or gallops. Brisk peripheral pulses without edema.  Chest: symmetric, with normal excursions and percussion.  Breasts: Symmetric, without lumps, nipple discharge, retractions.  Abdomen: Soft, nontender, no guarding, rebound, hernias, masses, or organomegaly.  Lymphatics: Non tender without lymphadenopathy.  Musculoskeletal: Full ROM all peripheral extremities,5/5 strength, and normal  gait.  Skin: Warm, dry without rashes, lesions, ecchymosis. Neuro: Cranial nerves intact, reflexes equal bilaterally. Normal muscle tone, no cerebellar symptoms. Sensation intact.  Psych: Awake and oriented X 3, normal affect, Insight and Judgment appropriate.  Genitourinary: {genitalia BIPJ:7939688}  EKG: WNL no ST changes.   Darrol Jump, NP 9:06 AM [facility]

## 2022-08-21 ENCOUNTER — Encounter: Payer: 59 | Admitting: Nurse Practitioner

## 2022-10-15 ENCOUNTER — Encounter: Payer: Self-pay | Admitting: Nurse Practitioner

## 2022-10-15 ENCOUNTER — Ambulatory Visit (INDEPENDENT_AMBULATORY_CARE_PROVIDER_SITE_OTHER): Payer: 59 | Admitting: Nurse Practitioner

## 2022-10-15 VITALS — BP 130/82 | HR 78 | Temp 97.9°F | Ht 60.0 in | Wt 158.4 lb

## 2022-10-15 DIAGNOSIS — Z0001 Encounter for general adult medical examination with abnormal findings: Secondary | ICD-10-CM

## 2022-10-15 DIAGNOSIS — K2100 Gastro-esophageal reflux disease with esophagitis, without bleeding: Secondary | ICD-10-CM

## 2022-10-15 DIAGNOSIS — R1011 Right upper quadrant pain: Secondary | ICD-10-CM

## 2022-10-15 DIAGNOSIS — E663 Overweight: Secondary | ICD-10-CM

## 2022-10-15 DIAGNOSIS — E785 Hyperlipidemia, unspecified: Secondary | ICD-10-CM

## 2022-10-15 DIAGNOSIS — E559 Vitamin D deficiency, unspecified: Secondary | ICD-10-CM

## 2022-10-15 DIAGNOSIS — Z136 Encounter for screening for cardiovascular disorders: Secondary | ICD-10-CM

## 2022-10-15 DIAGNOSIS — Z0000 Encounter for general adult medical examination without abnormal findings: Secondary | ICD-10-CM | POA: Diagnosis not present

## 2022-10-15 DIAGNOSIS — Z131 Encounter for screening for diabetes mellitus: Secondary | ICD-10-CM

## 2022-10-15 DIAGNOSIS — Z1389 Encounter for screening for other disorder: Secondary | ICD-10-CM

## 2022-10-15 DIAGNOSIS — Z79899 Other long term (current) drug therapy: Secondary | ICD-10-CM

## 2022-10-15 DIAGNOSIS — Z1231 Encounter for screening mammogram for malignant neoplasm of breast: Secondary | ICD-10-CM

## 2022-10-15 DIAGNOSIS — R7309 Other abnormal glucose: Secondary | ICD-10-CM

## 2022-10-15 DIAGNOSIS — Z1329 Encounter for screening for other suspected endocrine disorder: Secondary | ICD-10-CM

## 2022-10-15 DIAGNOSIS — F17200 Nicotine dependence, unspecified, uncomplicated: Secondary | ICD-10-CM

## 2022-10-15 NOTE — Progress Notes (Signed)
Complete Physical  Assessment and Plan:  Encounter for general adult medical examination with abnormal findings Due annually Health maintenance reviewed  Overweight Discussed appropriate BMI Diet modification. Physical activity. Encouraged/praised to build confidence.   Tobacco use disorder Smoking cessation instruction/counseling given:  counseled patient on the dangers of tobacco use, advised patient to stop smoking, and reviewed strategies to maximize success.     Gastroesophageal reflux disease with esophagitis without hemorrhage No suspected reflux complications (Barret/stricture). Lifestyle modification:  wt loss, avoid meals 2-3h before bedtime. Consider eliminating food triggers:  chocolate, caffeine, EtOH, acid/spicy food.   Hyperlipidemia, unspecified hyperlipidemia type Discussed lifestyle modifications. Recommended diet heavy in fruits and veggies, omega 3's. Decrease consumption of animal meats, cheeses, and dairy products. Remain active and exercise as tolerated. Continue to monitor. Check lipids/TSH   Vitamin D deficiency Continue supplement Monitor levels  Other abnormal glucose Education: Reviewed 'ABCs' of diabetes management  Discussed goals to be met and/or maintained include A1C (<7) Blood pressure (<130/80) Cholesterol (LDL <70) Continue Eye Exam yearly  Continue Dental Exam Q6 mo Discussed dietary recommendations Discussed Physical Activity recommendations Foot exam UTD Check A1C   Medication management All medications discussed and reviewed in full. All questions and concerns regarding medications addressed.     Encounter for screening mammogram for malignant neoplasm of breast Mammogram ordered  Screening for cardiovascular condition EKG  Screening for diabetes mellitus Check and monitor A1c  Screening for thyroid disorder Check and monitor TSH  Screening for hematuria or proteinuria Check and monitor  UA/Microalbumin     Notify office for further evaluation and treatment, questions or concerns if any reported s/s fail to improve.   The patient was advised to call back or seek an in-person evaluation if any symptoms worsen or if the condition fails to improve as anticipated.   Further disposition pending results of labs. Discussed med's effects and SE's.    I discussed the assessment and treatment plan with the patient. The patient was provided an opportunity to ask questions and all were answered. The patient agreed with the plan and demonstrated an understanding of the instructions.  Discussed med's effects and SE's. Screening labs and tests as requested with regular follow-up as recommended.  I provided 40 minutes of face-to-face time during this encounter including counseling, chart review, and critical decision making was preformed.    Future Appointments  Date Time Provider [place]  10/16/2023  3:00 PM Darrol Jump, NP [facility] None    HPI  57 y.o. female  presents for a complete physical. She has DIARRHEA; Family history of colonic polyps; Hyperlipidemia; GERD (gastroesophageal reflux disease); Vitamin D deficiency; Tobacco use disorder; Other abnormal glucose; and Obesity on their problem list.  Overall she reports feeling well today. She has no now or additional concerns.    BMI is Body mass index is 30.94 kg/m., she {HAS HAS HCW:23762} been working on diet and exercise. Wt Readings from Last 3 Encounters:  10/15/22 158 lb 6.4 oz (71.8 kg)  07/16/22 150 lb (68 kg)  07/16/22 151 lb 3.2 oz (68.6 kg)   Her blood pressure {HAS HAS NOT:18834} been controlled at home, today their BP is BP: 130/82 She {DOES_DOES GBT:51761} workout. She denies chest pain, shortness of breath, dizziness.   She {ACTION; IS/IS YWV:37106269} on cholesterol medication and denies myalgias. Her cholesterol {ACTION; IS/IS NOT:21021397} at goal. The cholesterol last visit was:   Lab  Results  Component Value Date   CHOL 224 (H) 12/19/2017   HDL 53  12/19/2017   LDLCALC 152 (H) 12/19/2017   TRIG 88 12/19/2017   CHOLHDL 4.2 12/19/2017   She {Has/has not:18111} been working on diet and exercise for ***, she {ACTION; IS/IS NOT:21021397} on bASA, she {ACTION; IS/IS NOT:21021397} on ACE/ARB and denies {Symptoms; diabetes w/o none:19199}. Last A1C in the office was:  Lab Results  Component Value Date   HGBA1C 5.3 09/03/2016   Last GFR: No results found for: "EGFR" Patient is on Vitamin D supplement.   Lab Results  Component Value Date   VD25OH 24 (L) 12/19/2017      Current Medications:  Current Outpatient Medications on File Prior to Visit  Medication Sig Dispense Refill  . ondansetron (ZOFRAN-ODT) 4 MG disintegrating tablet Take 1 tablet (4 mg total) by mouth every 8 (eight) hours as needed for nausea or vomiting. 20 tablet 0  . promethazine (PHENERGAN) 12.5 MG tablet Take 1 tablet (12.5 mg total) by mouth every 6 (six) hours as needed for nausea or vomiting. 7 tablet 0   No current facility-administered medications on file prior to visit.   Allergies:  Allergies  Allergen Reactions  . No Known Allergies   . Other     Stitches used after growth on back removed, caused redness and itching   Medical History:  She has DIARRHEA; Family history of colonic polyps; Hyperlipidemia; GERD (gastroesophageal reflux disease); Vitamin D deficiency; Tobacco use disorder; Other abnormal glucose; and Obesity on their problem list.  Health Maintenance:   Immunization History  Administered Date(s) Administered  . Tdap 07/29/2011   Health Maintenance  Topic Date Due  . COVID-19 Vaccine (1) Never done  . Zoster Vaccines- Shingrix (1 of 2) Never done  . MAMMOGRAM  10/25/2016  . PAP SMEAR-Modifier  11/18/2016  . DTaP/Tdap/Td (2 - Td or Tdap) 07/28/2021  . INFLUENZA VACCINE  Never done  . COLONOSCOPY (Pts 45-37yr Insurance coverage will need to be confirmed)  01/30/2026   . Hepatitis C Screening  Completed  . HIV Screening  Completed  . HPV VACCINES  Aged Out    LMP: Patient's last menstrual period was 07/24/2014. Sexually Active: {YES NO:22349} STD testing offered Pap: MGM:  DEXA:  Colonoscopy: EGD:  Last Dental Exam: Periodontitis also - J[facility] Blockerlast seen 12/2021 [facility] Last Derm Exam:  Does not follow - no new concerns.   Patient Care Team: MUnk Pinto MD as PCP - General (Internal Medicine) SLadene Artist MD as Consulting Physician (Gastroenterology) HBobbye Charleston MD as Consulting Physician (Obstetrics and Gynecology)  Surgical History:  She has a past surgical history that includes Lumbar disc surgery; Cesarean section (1985, 1993); Esophagogastroduodenoscopy (06/03/2012); Colonoscopy (2013); and Upper gastrointestinal endoscopy. Family History:  Herfamily history includes Colon polyps in her mother; Hemochromatosis in her brother; Multiple sclerosis in her mother. Social History:  She reports that she has been smoking cigarettes. She has a 8.50 pack-year smoking history. She has never used smokeless tobacco. She reports current alcohol use. She reports that she does not use drugs.  Review of Systems: ROS  Physical Exam: Estimated body mass index is 30.94 kg/m as calculated from the following:   Height as of this encounter: 5' (1.524 m).   Weight as of this encounter: 158 lb 6.4 oz (71.8 kg). BP 130/82   Pulse 78   Temp 97.9 F (36.6 C)   Ht 5' (1.524 m)   Wt 158 lb 6.4 oz (71.8 kg)   LMP 07/24/2014   SpO2  96%   BMI 30.94 kg/m   General Appearance: Well nourished, well developed, in no apparent distress.  Eyes: PERRLA, EOMs, conjunctiva no swelling or erythema, normal fundi and vessels.  Sinuses: No Frontal/maxillary tenderness  ENT/Mouth: Ext aud canals clear, normal light reflex with TMs without erythema, bulging. Good dentition. No erythema, swelling, or exudate on post  pharynx. Tonsils not swollen or erythematous. Hearing normal.  Neck: Supple, thyroid normal. No bruits  Respiratory: Respiratory effort normal, BS equal bilaterally without rales, rhonchi, wheezing or stridor.  Cardio: RRR without murmurs, rubs or gallops. Brisk peripheral pulses without edema.  Chest: symmetric, with normal excursions and percussion.  Breasts: Symmetric, without lumps, nipple discharge, retractions.  Abdomen: Soft, nontender, no guarding, rebound, hernias, masses, or organomegaly.  Lymphatics: Non tender without lymphadenopathy.  Musculoskeletal: Full ROM all peripheral extremities,5/5 strength, and normal gait.  Skin: Warm, dry without rashes, lesions, ecchymosis. Neuro: Cranial nerves intact, reflexes equal bilaterally. Normal muscle tone, no cerebellar symptoms. Sensation intact.  Psych: Awake and oriented X 3, normal affect, Insight and Judgment appropriate.  Genitourinary: {genitalia JOAC:1660630}  EKG: WNL no ST changes.   Darrol Jump, NP 3:42 PM [facility]

## 2022-10-15 NOTE — Patient Instructions (Signed)

## 2022-10-16 ENCOUNTER — Encounter: Payer: Self-pay | Admitting: Nurse Practitioner

## 2022-10-16 ENCOUNTER — Telehealth: Payer: Self-pay | Admitting: Nurse Practitioner

## 2022-10-16 ENCOUNTER — Other Ambulatory Visit: Payer: Self-pay | Admitting: Nurse Practitioner

## 2022-10-16 DIAGNOSIS — K824 Cholesterolosis of gallbladder: Secondary | ICD-10-CM

## 2022-10-16 DIAGNOSIS — R1011 Right upper quadrant pain: Secondary | ICD-10-CM

## 2022-10-16 DIAGNOSIS — R1013 Epigastric pain: Secondary | ICD-10-CM

## 2022-10-16 DIAGNOSIS — K7689 Other specified diseases of liver: Secondary | ICD-10-CM

## 2022-10-16 LAB — CBC WITH DIFFERENTIAL/PLATELET
Absolute Monocytes: 648 cells/uL (ref 200–950)
Basophils Absolute: 36 cells/uL (ref 0–200)
Basophils Relative: 0.4 %
Eosinophils Absolute: 252 cells/uL (ref 15–500)
Eosinophils Relative: 2.8 %
HCT: 42.8 % (ref 35.0–45.0)
Hemoglobin: 14.9 g/dL (ref 11.7–15.5)
Lymphs Abs: 2889 cells/uL (ref 850–3900)
MCH: 32.7 pg (ref 27.0–33.0)
MCHC: 34.8 g/dL (ref 32.0–36.0)
MCV: 94.1 fL (ref 80.0–100.0)
MPV: 10.5 fL (ref 7.5–12.5)
Monocytes Relative: 7.2 %
Neutro Abs: 5175 cells/uL (ref 1500–7800)
Neutrophils Relative %: 57.5 %
Platelets: 332 10*3/uL (ref 140–400)
RBC: 4.55 10*6/uL (ref 3.80–5.10)
RDW: 12.5 % (ref 11.0–15.0)
Total Lymphocyte: 32.1 %
WBC: 9 10*3/uL (ref 3.8–10.8)

## 2022-10-16 LAB — COMPLETE METABOLIC PANEL WITH GFR
AG Ratio: 1.7 (calc) (ref 1.0–2.5)
ALT: 12 U/L (ref 6–29)
AST: 14 U/L (ref 10–35)
Albumin: 4.5 g/dL (ref 3.6–5.1)
Alkaline phosphatase (APISO): 76 U/L (ref 37–153)
BUN: 12 mg/dL (ref 7–25)
CO2: 27 mmol/L (ref 20–32)
Calcium: 9.9 mg/dL (ref 8.6–10.4)
Chloride: 106 mmol/L (ref 98–110)
Creat: 0.66 mg/dL (ref 0.50–1.03)
Globulin: 2.6 g/dL (calc) (ref 1.9–3.7)
Glucose, Bld: 87 mg/dL (ref 65–99)
Potassium: 4.7 mmol/L (ref 3.5–5.3)
Sodium: 141 mmol/L (ref 135–146)
Total Bilirubin: 0.3 mg/dL (ref 0.2–1.2)
Total Protein: 7.1 g/dL (ref 6.1–8.1)
eGFR: 102 mL/min/{1.73_m2} (ref >=60)

## 2022-10-16 LAB — LIPID PANEL
Cholesterol: 265 mg/dL — ABNORMAL HIGH (ref <200)
HDL: 59 mg/dL (ref >=50)
LDL Cholesterol (Calc): 181 mg/dL (calc) — ABNORMAL HIGH
Non-HDL Cholesterol (Calc): 206 mg/dL (calc) — ABNORMAL HIGH (ref <130)
Total CHOL/HDL Ratio: 4.5 (calc) (ref <5.0)
Triglycerides: 122 mg/dL (ref <150)

## 2022-10-16 LAB — URINALYSIS, ROUTINE W REFLEX MICROSCOPIC
Bilirubin Urine: NEGATIVE
Glucose, UA: NEGATIVE
Hgb urine dipstick: NEGATIVE
Ketones, ur: NEGATIVE
Leukocytes,Ua: NEGATIVE
Nitrite: NEGATIVE
Protein, ur: NEGATIVE
Specific Gravity, Urine: 1.019 (ref 1.001–1.035)
pH: <=5 (ref 5.0–8.0)

## 2022-10-16 LAB — HEMOGLOBIN A1C
Hgb A1c MFr Bld: 5.7 % of total Hgb — ABNORMAL HIGH (ref <5.7)
Mean Plasma Glucose: 117 mg/dL
eAG (mmol/L): 6.5 mmol/L

## 2022-10-16 LAB — MICROALBUMIN / CREATININE URINE RATIO
Creatinine, Urine: 105 mg/dL (ref 20–275)
Microalb Creat Ratio: 3 mcg/mg creat (ref <30)
Microalb, Ur: 0.3 mg/dL

## 2022-10-16 LAB — VITAMIN D 25 HYDROXY (VIT D DEFICIENCY, FRACTURES): Vit D, 25-Hydroxy: 11 ng/mL — ABNORMAL LOW (ref 30–100)

## 2022-10-16 LAB — INSULIN, RANDOM: Insulin: 11.1 u[IU]/mL

## 2022-10-16 LAB — TSH: TSH: 1.71 mIU/L (ref 0.40–4.50)

## 2022-10-16 MED ORDER — PANTOPRAZOLE SODIUM 40 MG PO TBEC: 40.0000 mg | DELAYED_RELEASE_TABLET | Freq: Every day | ORAL | 0 refills | Status: DC

## 2022-10-16 NOTE — Telephone Encounter (Signed)
Medication from yesterday's visit was not called in for her

## 2022-10-25 ENCOUNTER — Ambulatory Visit
Admission: RE | Admit: 2022-10-25 | Discharge: 2022-10-25 | Disposition: A | Discharge status: Home / Self Care | Payer: 59 | Source: Ambulatory Visit | Attending: Nurse Practitioner | Admitting: Nurse Practitioner

## 2022-10-25 DIAGNOSIS — K824 Cholesterolosis of gallbladder: Secondary | ICD-10-CM

## 2022-10-25 DIAGNOSIS — K7689 Other specified diseases of liver: Secondary | ICD-10-CM

## 2022-10-25 DIAGNOSIS — R1011 Right upper quadrant pain: Secondary | ICD-10-CM

## 2022-10-25 DIAGNOSIS — R1013 Epigastric pain: Secondary | ICD-10-CM

## 2022-10-25 MED ORDER — IOPAMIDOL (ISOVUE-300) INJECTION 61%
100.0000 mL | Freq: Once | INTRAVENOUS | Status: AC | PRN
  Administered 2022-10-25: 100 mL via INTRAVENOUS

## 2022-10-27 ENCOUNTER — Other Ambulatory Visit: Payer: Self-pay | Admitting: Nurse Practitioner

## 2022-10-27 DIAGNOSIS — K7689 Other specified diseases of liver: Secondary | ICD-10-CM

## 2022-10-27 DIAGNOSIS — R1011 Right upper quadrant pain: Secondary | ICD-10-CM

## 2022-11-07 ENCOUNTER — Encounter: Payer: Self-pay | Admitting: *Deleted

## 2022-11-15 ENCOUNTER — Encounter: Payer: Self-pay | Admitting: Nurse Practitioner

## 2022-11-15 ENCOUNTER — Ambulatory Visit: Payer: 59 | Admitting: Nurse Practitioner

## 2022-11-15 VITALS — BP 132/80 | HR 96 | Ht 60.0 in | Wt 154.0 lb

## 2022-11-15 DIAGNOSIS — R110 Nausea: Secondary | ICD-10-CM | POA: Diagnosis not present

## 2022-11-15 DIAGNOSIS — R1011 Right upper quadrant pain: Secondary | ICD-10-CM

## 2022-11-15 NOTE — Progress Notes (Unsigned)
Assessment    Patient profile:  Laura Kelley is a 57 y.o. female known to Dr. Fuller Plan. She has a [facility] of colon cancer. See PMH /PSH for additional history. Patient is referred by PCP for evaluation of RUQ pain and a liver cyst on imaging  # 57 yo female with nausea and RUQ pain over the last few months.  No biliary findings on [place] nor CT scan. There is a cyst in the hepatic dome on CT AP with contrast but unlikely source of symptoms, see below. Consider recurrent duodenal ulcer as cause for her symptoms, especially since she says the pain is reminiscent of when she was diagnosed with an ulcer ( in 2013) . 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 Seems stable in size compared to ultrasound in 2016. Not likely the cause of her RUQ.    Plan   Schedule for EGD to evaluate nausea and RUQ pain. The risks and benefits of EGD with possible biopsies were discussed with the patient who agrees to proceed.  Not taking NSAIDs May need follow [place] in a year to make sure liver cyst remains stable in size.   If EGD negative consider HIDA   HPI    Chief complaint: nausea and right upper abdominal pain   Laura Kelley has a [facility] of colon cancer. She had multiple hyperplastic polyps on colonoscopy in 2022 and a 5 year follow up was recommended.   Laura Kelley has been having nausea and RUQ pain for a couple of months. She describes RUQ pain as a "soreness". It feels like a really bad hunger pain but eating doesn't necessarily relieve the pain. She has excessive belching and hiccups.   The pain feels like what she had when diagnosed with a duodenal ulcer in 2013. She isn't taking NSAIDs. No blood in stools or black stools.  She took two weeks of Nexium in December with seemed to help her symptoms.   For evaluation of abdominal pain she had a CT AP w contrast on 10/25/22. A 1.6 cm liver  cyst was seen  There was a 5 cm simple cyst in the right kidney but no acute abnormalities to explain the pain. Gallbladder appeared normal.   Recent LFTs and CBC were normal.  She had mildly elevated liver enzymes in Sept 2023 but had COVID19 at the time   Previous GI Evaluation  EGD 2013  -Duodenal ulcer, duodenitis, gastritis  Labs:     Latest Ref Rng & Units 10/15/2022    4:09 PM 07/11/2022    7:21 PM 07/07/2022    9:02 PM  CBC  WBC 3.8 - 10.8 Thousand/uL 9.0  8.5  5.0   Hemoglobin 11.7 - 15.5 g/dL 14.9  14.9  14.4   Hematocrit 35.0 - 45.0 % 42.8  41.8  41.2   Platelets 140 - 400 Thousand/uL 332  319  243        Latest Ref Rng & Units 10/15/2022    4:09 PM 07/07/2022    9:02 PM 12/19/2017   12:12 PM  Hepatic Function  Total Protein 6.1 - 8.1 g/dL 7.1  7.1  7.0   Albumin 3.5 - 5.0 g/dL  3.8    AST 10 - 35 U/L 14  65  14   ALT 6 - 29 U/L 12  74  12   Alk Phosphatase 38 - 126 U/L  68    Total Bilirubin 0.2 - 1.2 mg/dL 0.3  0.4  0.3   Bilirubin, Direct 0.0 - 0.2 mg/dL   0.1      Past Medical History:  Diagnosis Date   Anxiety    Aortic atherosclerosis (HCC)    Diverticulosis    Gallbladder polyp    GERD (gastroesophageal reflux disease)    occ s/s   History of duodenal ulcer 06/03/2012   + Hpylori treated 2013    Hyperlipidemia    Hypertrophied anal papilla    Internal hemorrhoids    Renal cyst    Ulcer    Vitamin D deficiency     Past Surgical History:  Procedure Laterality Date   [place]   x 2   COLONOSCOPY  2013   ESOPHAGOGASTRODUODENOSCOPY  06/03/2012   Procedure: ESOPHAGOGASTRODUODENOSCOPY (EGD);  Surgeon: Ladene Artist, MD,FACG;  Location: [facility];  Service: Endoscopy;  Laterality: N/A;   LUMBAR DISC SURGERY     Dr. Durene Cal   UPPER GASTROINTESTINAL ENDOSCOPY      Current Medications, Allergies, Family History and Social History were reviewed in [place] record.     Current Outpatient Medications   Medication Sig Dispense Refill   acetaminophen (TYLENOL) 325 MG tablet Take 650 mg by mouth as needed.     No current facility-administered medications for this visit.    Review of Systems: No chest pain. No shortness of breath. No urinary complaints.    Physical Exam  Wt Readings from Last 3 Encounters:  11/15/22 154 lb (69.9 kg)  10/15/22 158 lb 6.4 oz (71.8 kg)  07/16/22 150 lb (68 kg)    BP 132/80   Pulse 96   Ht 5' (1.524 m)   Wt 154 lb (69.9 kg)   LMP 07/24/2014   SpO2 98%   BMI 30.08 kg/m  Constitutional:  Pleasant, generally well appearing female in no acute distress. Psychiatric: Normal mood and affect. Behavior is normal. EENT: Pupils normal.  Conjunctivae are normal. No scleral icterus. Neck supple.  Cardiovascular: Normal rate, regular rhythm.  Pulmonary/chest: Effort normal and breath sounds normal. No wheezing, rales or rhonchi. Abdominal: Soft, nondistended, nontender. Bowel sounds active throughout. There are no masses palpable. No hepatomegaly. Neurological: Alert and oriented to person place and time. Skin: Skin is warm and dry. No rashes noted.  [name], NP  11/15/2022, 3:16 PM  Cc:  Darrol Jump, NP

## 2022-11-15 NOTE — Patient Instructions (Signed)
You have been scheduled for an endoscopy. Please follow written instructions given to you at your visit today. If you use inhalers (even only as needed), please bring them with you on the day of your procedure.  _______________________________________________________  If your blood pressure at your visit was 140/90 or greater, please contact your primary care physician to follow up on this.  _______________________________________________________  If you are age 28 or older, your body mass index should be between 23-30. Your Body mass index is 30.08 kg/m. If this is out of the aforementioned range listed, please consider follow up with your Primary Care Provider.  If you are age 76 or younger, your body mass index should be between 19-25. Your Body mass index is 30.08 kg/m. If this is out of the aformentioned range listed, please consider follow up with your Primary Care Provider.   ________________________________________________________  The [facility] providers would like to encourage you to use [facility] to communicate with providers for non-urgent requests or questions.  Due to long hold times on the telephone, sending your provider a message by [facility] may be a faster and more efficient way to get a response.  Please allow 48 business hours for a response.  Please remember that this is for non-urgent requests.  _______________________________________________________  Due to recent changes in healthcare laws, you may see the results of your imaging and laboratory studies on MyChart before your provider has had a chance to review them.  We understand that in some cases there may be results that are confusing or concerning to you. Not all laboratory results come back in the same time frame and the provider may be waiting for multiple results in order to interpret others.  Please give [place] 48 hours in order for your provider to thoroughly review all the results before contacting the office for  clarification of your results.

## 2022-11-18 ENCOUNTER — Encounter: Payer: Self-pay | Admitting: Nurse Practitioner

## 2022-11-18 NOTE — Progress Notes (Signed)
Her hepatic cyst has been stable over time on imaging therefore no plans for follow up imaging.  PPI qd, avoid NSAIDs for now and EGD as planned.

## 2022-11-20 ENCOUNTER — Encounter: Payer: Self-pay | Admitting: Gastroenterology

## 2022-11-20 ENCOUNTER — Ambulatory Visit (AMBULATORY_SURGERY_CENTER): Payer: 59 | Admitting: Gastroenterology

## 2022-11-20 VITALS — BP 126/74 | HR 66 | Temp 98.2°F | Resp 13 | Ht 60.0 in | Wt 154.0 lb

## 2022-11-20 DIAGNOSIS — K2990 Gastroduodenitis, unspecified, without bleeding: Secondary | ICD-10-CM

## 2022-11-20 DIAGNOSIS — R1011 Right upper quadrant pain: Secondary | ICD-10-CM

## 2022-11-20 DIAGNOSIS — K2970 Gastritis, unspecified, without bleeding: Secondary | ICD-10-CM

## 2022-11-20 HISTORY — PX: UPPER GASTROINTESTINAL ENDOSCOPY: SHX188

## 2022-11-20 MED ORDER — SODIUM CHLORIDE 0.9 % IV SOLN: 500.0000 mL | Freq: Once | INTRAVENOUS | Status: DC

## 2022-11-20 MED ORDER — PANTOPRAZOLE SODIUM 40 MG PO TBEC: 40.0000 mg | DELAYED_RELEASE_TABLET | Freq: Two times a day (BID) | ORAL | 3 refills | Status: DC

## 2022-11-20 NOTE — Progress Notes (Signed)
During instructions, pt c/o pain to upper right abdomen and up into right shoulder, states having pain of an 8, heat pack applied, repostitioned, pt walked, felt like pain is a 5 now, call to dr stark, comes to see pt,

## 2022-11-20 NOTE — Patient Instructions (Addendum)
Information on gastritis given to you today.  Await pathology results from the biopsies taken today.  Pantoprazole 40 mg by mouth twice a day for 1 month, then 40 mg once a day.  Return to GI clinic in 6 weeks.  Resume previous diet and medications.   YOU HAD AN ENDOSCOPIC PROCEDURE TODAY AT [facility]:   Refer to the procedure report that was given to you for any specific questions about what was found during the examination.  If the procedure report does not answer your questions, please call your gastroenterologist to clarify.  If you requested that your care partner not be given the details of your procedure findings, then the procedure report has been included in a sealed envelope for you to review at your convenience later.  YOU SHOULD EXPECT: Some feelings of bloating in the abdomen. Passage of more gas than usual.  Walking can help get rid of the air that was put into your GI tract during the procedure and reduce the bloating. If you had a lower endoscopy (such as a colonoscopy or flexible sigmoidoscopy) you may notice spotting of blood in your stool or on the toilet paper. If you underwent a bowel prep for your procedure, you may not have a normal bowel movement for a few days.  Please Note:  You might notice some irritation and congestion in your nose or some drainage.  This is from the oxygen used during your procedure.  There is no need for concern and it should clear up in a day or so.  SYMPTOMS TO REPORT IMMEDIATELY:  Following upper endoscopy (EGD)  Vomiting of blood or coffee ground material  New chest pain or pain under the shoulder blades  Painful or persistently difficult swallowing  New shortness of breath  Fever of 100F or higher  Black, tarry-looking stools  For urgent or emergent issues, a gastroenterologist can be reached at any hour by calling [phone]. Do not use MyChart messaging for urgent concerns.   DIET:  We do recommend a small  meal at first, but then you may proceed to your regular diet.  Drink plenty of fluids but you should avoid alcoholic beverages for 24 hours.  ACTIVITY:  You should plan to take it easy for the rest of today and you should NOT DRIVE or use heavy machinery until tomorrow (because of the sedation medicines used during the test).    FOLLOW UP: Our staff will call the number listed on your records the next business day following your procedure.  We will call around 7:15- 8:00 am to check on you and address any questions or concerns that you may have regarding the information given to you following your procedure. If we do not reach you, we will leave a message.     If any biopsies were taken you will be contacted by phone or by letter within the next 1-3 weeks.  Please call us at [phone] if you have not heard about the biopsies in 3 weeks.    SIGNATURES/CONFIDENTIALITY: You and/or your care partner have signed paperwork which will be entered into your electronic medical record.  These signatures attest to the fact that that the information above on your After Visit Summary has been reviewed and is understood.  Full responsibility of the confidentiality of this discharge information lies with you and/or your care-partner.

## 2022-11-20 NOTE — Progress Notes (Signed)
See 11/15/2022 H&P, no changes 

## 2022-11-20 NOTE — Progress Notes (Signed)
Pt's states no medical or surgical changes since previsit or office visit. 

## 2022-11-20 NOTE — Progress Notes (Signed)
Sedate, gd SR, tolerated procedure well, VSS, report to RN 

## 2022-11-20 NOTE — Op Note (Signed)
[facility] Patient Name: Laura Kelley Procedure Date: 11/20/2022 11:32 AM MRN: [id number] Endoscopist: Ladene Artist , MD, [id number] Age: 57 Referring MD:  Date of Birth: 03-Jan-1966 Gender: Female Account #: [id number] Procedure:                Upper GI endoscopy Indications:              Abdominal pain in the right upper quadrant Medicines:                Monitored Anesthesia Care Procedure:                Pre-Anesthesia Assessment:                           - Prior to the procedure, a History and Physical                            was performed, and patient medications and                            allergies were reviewed. The patient's tolerance of                            previous anesthesia was also reviewed. The risks                            and benefits of the procedure and the sedation                            options and risks were discussed with the patient.                            All questions were answered, and informed consent                            was obtained. Prior Anticoagulants: The patient has                            taken no anticoagulant or antiplatelet agents. ASA                            Grade Assessment: II - A patient with mild systemic                            disease. After reviewing the risks and benefits,                            the patient was deemed in satisfactory condition to                            undergo the procedure.                           After obtaining informed consent, the endoscope was  passed under direct vision. Throughout the                            procedure, the patient's blood pressure, pulse, and                            oxygen saturations were monitored continuously. The                            Olympus Scope [id number] was introduced through the                            mouth, and advanced to the second part of duodenum.                            The  upper GI endoscopy was accomplished without                            difficulty. The patient tolerated the procedure                            well. Scope In: Scope Out: Findings:                 The examined esophagus was normal.                           Patchy mild inflammation characterized by erythema                            and granularity was found in the gastric body and                            in the gastric antrum. Biopsies were taken with a                            cold forceps for histology.                           The exam of the stomach was otherwise normal.                           Multiple diffuse erosions without bleeding were                            found in the duodenal bulb.                           The exam of the duodenum was otherwise normal. Complications:            No immediate complications. Estimated Blood Loss:     Estimated blood loss was minimal. Impression:               - Normal esophagus.                           -  Gastritis. Biopsied.                           - Duodenal erosions without bleeding. Recommendation:           - Patient has a contact number available for                            emergencies. The signs and symptoms of potential                            delayed complications were discussed with the                            patient. Return to normal activities tomorrow.                            Written discharge instructions were provided to the                            patient.                           - Resume previous diet.                           - Continue present medications.                           - Pantoprazole 40 mg po bid for 1 month then 40 mg                            po qd, 1 year of refills.                           - Await pathology results.                           - Return to GI office in 6 weeks with PG or me. Ladene Artist, MD 11/20/2022 12:00:05 PM This report has been signed  electronically.

## 2022-11-20 NOTE — Progress Notes (Signed)
Called to room to assist during endoscopic procedure.  Patient ID and intended procedure confirmed with present staff. Received instructions for my participation in the procedure from the performing physician.  

## 2022-11-21 ENCOUNTER — Telehealth: Payer: Self-pay | Admitting: *Deleted

## 2022-11-21 NOTE — Telephone Encounter (Signed)
  Follow up Call-     11/20/2022   10:54 AM 01/30/2021    1:18 PM  Call back number  Post procedure Call Back phone  # [phone]  Permission to leave phone message Yes Yes     Patient questions:  Do you have a fever, pain , or abdominal swelling? No. Pain Score  0 *  Have you tolerated food without any problems? Yes.    Have you been able to return to your normal activities? Yes.    Do you have any questions about your discharge instructions: Diet   No. Medications  No. Follow up visit  No.  Do you have questions or concerns about your Care? No.  Actions: * If pain score is 4 or above: No action needed, pain <4.

## 2022-11-28 ENCOUNTER — Encounter: Payer: Self-pay | Admitting: Gastroenterology

## 2022-11-28 ENCOUNTER — Telehealth: Payer: Self-pay

## 2022-11-28 NOTE — Telephone Encounter (Signed)
Left detailed message on pt's voicemail. Let pt know she can give [place] a call back if she has any questions about recommendations.

## 2022-11-28 NOTE — Telephone Encounter (Signed)
-----   Message from Laura Craze, NP sent at 11/18/2022  2:33 PM EST ----- Mickel Baas, please let her know that Dr. Fuller Plan looked at her imaging. 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 No need for repeat or additional imaging of the cyst since it has been stable for years.   I know she is having an EGD soon so we will see what that shows. In the meantime, remind her about no NSAIDS. Depending on what is found on EGD in a couple of days Dr. Fuller Plan may end up putting her on a PPI.   Thanks

## 2022-12-03 ENCOUNTER — Telehealth: Payer: Self-pay | Admitting: Gastroenterology

## 2022-12-03 NOTE — Telephone Encounter (Signed)
Inbound call from patient, requesting to speak with a nurse in regards to her EGD results.

## 2022-12-03 NOTE — Telephone Encounter (Signed)
I have called and notified the pt of the letter in My Chart per voice mail. I have asked her to call back if she does not see the letter or if she has any questions.    Laura Kelley                                                                                                         [address] [place] [address]                                                                                          Dear Laura Kelley,   I am writing to inform you that the biopsies taken during your recent endoscopic examination showed mild chronic gastritis and reactive gastropathy which are benign irritations of the stomach lining.    Please call us at [phone] if you have persistent problems or have questions about your condition that have not been fully answered at this time.   Sincerely,   Ladene Artist, MD

## 2022-12-09 ENCOUNTER — Other Ambulatory Visit: Payer: Self-pay | Admitting: Obstetrics and Gynecology

## 2022-12-09 DIAGNOSIS — E2839 Other primary ovarian failure: Secondary | ICD-10-CM

## 2023-01-01 ENCOUNTER — Ambulatory Visit: Payer: 59 | Admitting: Gastroenterology

## 2023-01-11 ENCOUNTER — Other Ambulatory Visit: Payer: Self-pay | Admitting: Nurse Practitioner

## 2023-01-11 DIAGNOSIS — K2990 Gastroduodenitis, unspecified, without bleeding: Secondary | ICD-10-CM

## 2023-01-28 ENCOUNTER — Encounter: Payer: Self-pay | Admitting: Gastroenterology

## 2023-01-28 ENCOUNTER — Ambulatory Visit: Payer: 59 | Admitting: Gastroenterology

## 2023-01-28 VITALS — BP 118/72 | HR 79 | Ht 61.0 in | Wt 153.0 lb

## 2023-01-28 DIAGNOSIS — Z800 Family history of malignant neoplasm of digestive organs: Secondary | ICD-10-CM

## 2023-01-28 DIAGNOSIS — K269 Duodenal ulcer, unspecified as acute or chronic, without hemorrhage or perforation: Secondary | ICD-10-CM | POA: Diagnosis not present

## 2023-01-28 NOTE — Patient Instructions (Signed)
Continue pantoprazole 40 mg daily.   Please avoid all NSAID's. Some examples of NSAID's are as follows: Aspirin (Bufferin, Bayer, and Excedrin) Ibuprofen (Advil, Motrin, Nuprin) Ketoprofen (Actron, Orudis) Naproxen (Aleve) Daypro  Indocin  Lodine  Naprosyn  Relafen  Vimovo Voltaren  The [facility] providers would like to encourage you to use [facility] to communicate with providers for non-urgent requests or questions.  Due to long hold times on the telephone, sending your provider a message by [facility] may be a faster and more efficient way to get a response.  Please allow 48 business hours for a response.  Please remember that this is for non-urgent requests.   Thank you for choosing me and [facility].  Venita Lick. Pleas Koch., MD., Clementeen Graham

## 2023-01-28 NOTE — Progress Notes (Signed)
    Assessment     Erosive duodenitis, non erosive gastritis Family history of colon cancer Diverticulosis Hepatic cyst   Recommendations    Continue pantoprazole 40 mg qd  Avoid or at least minimize NSAIDs High-fiber diet with adequate daily water intake REV in 1 year   HPI    This is a 57 year old female returning for follow-up of right upper quadrant pain.  EGD results as below.  Since maintaining pantoprazole daily her symptoms have resolved.  She had questions about diverticulosis noted on CT scan and colonoscopy.  EGD Feb 2024 Erosive duodenitis Gastritis  Path: reactive gastropathy & chronic gastritis, H pylori negative   Labs / Imaging       Latest Ref Rng & Units 10/15/2022    4:09 PM 07/07/2022    9:02 PM 12/19/2017   12:12 PM  Hepatic Function  Total Protein 6.1 - 8.1 g/dL 7.1  7.1  7.0   Albumin 3.5 - 5.0 g/dL  3.8    AST 10 - 35 U/L 14  65  14   ALT 6 - 29 U/L 12  74  12   Alk Phosphatase 38 - 126 U/L  68    Total Bilirubin 0.2 - 1.2 mg/dL 0.3  0.4  0.3   Bilirubin, Direct 0.0 - 0.2 mg/dL   0.1        Latest Ref Rng & Units 10/15/2022    4:09 PM 07/11/2022    7:21 PM 07/07/2022    9:02 PM  CBC  WBC 3.8 - 10.8 Thousand/uL 9.0  8.5  5.0   Hemoglobin 11.7 - 15.5 g/dL 16.1  09.6  04.5   Hematocrit 35.0 - 45.0 % 42.8  41.8  41.2   Platelets 140 - 400 Thousand/uL 332  319  243    Current Medications, Allergies, Past Medical History, Past Surgical History, Family History and Social History were reviewed in [employer] record.   Physical Exam: General: Well developed, well nourished, no acute distress Head: Normocephalic and atraumatic Eyes: Sclerae anicteric, EOMI Ears: Normal auditory acuity Psychological:  Alert and cooperative. Normal mood and affect   Latham Kinzler T. Russella Dar, MD 01/28/2023, 3:01 PM

## 2023-01-30 ENCOUNTER — Ambulatory Visit: Payer: 59 | Admitting: Nurse Practitioner

## 2023-08-06 ENCOUNTER — Encounter: Payer: 59 | Admitting: Nurse Practitioner

## 2023-10-16 ENCOUNTER — Ambulatory Visit (INDEPENDENT_AMBULATORY_CARE_PROVIDER_SITE_OTHER): Payer: 59 | Admitting: Nurse Practitioner

## 2023-10-16 VITALS — BP 142/82 | HR 87 | Temp 98.4°F | Ht 61.0 in | Wt 163.8 lb

## 2023-10-16 DIAGNOSIS — Z79899 Other long term (current) drug therapy: Secondary | ICD-10-CM

## 2023-10-16 DIAGNOSIS — K2100 Gastro-esophageal reflux disease with esophagitis, without bleeding: Secondary | ICD-10-CM

## 2023-10-16 DIAGNOSIS — R7309 Other abnormal glucose: Secondary | ICD-10-CM

## 2023-10-16 DIAGNOSIS — Z1389 Encounter for screening for other disorder: Secondary | ICD-10-CM

## 2023-10-16 DIAGNOSIS — Z136 Encounter for screening for cardiovascular disorders: Secondary | ICD-10-CM | POA: Diagnosis not present

## 2023-10-16 DIAGNOSIS — Z1329 Encounter for screening for other suspected endocrine disorder: Secondary | ICD-10-CM

## 2023-10-16 DIAGNOSIS — I10 Essential (primary) hypertension: Secondary | ICD-10-CM

## 2023-10-16 DIAGNOSIS — Z0001 Encounter for general adult medical examination with abnormal findings: Secondary | ICD-10-CM

## 2023-10-16 DIAGNOSIS — Z0000 Encounter for general adult medical examination without abnormal findings: Secondary | ICD-10-CM | POA: Diagnosis not present

## 2023-10-16 DIAGNOSIS — E559 Vitamin D deficiency, unspecified: Secondary | ICD-10-CM

## 2023-10-16 DIAGNOSIS — F17200 Nicotine dependence, unspecified, uncomplicated: Secondary | ICD-10-CM

## 2023-10-16 DIAGNOSIS — E663 Overweight: Secondary | ICD-10-CM

## 2023-10-16 DIAGNOSIS — E785 Hyperlipidemia, unspecified: Secondary | ICD-10-CM

## 2023-10-16 DIAGNOSIS — Z130 Encounter for screening for diseases of the blood and blood-forming organs and certain disorders involving the immune mechanism: Secondary | ICD-10-CM

## 2023-10-16 DIAGNOSIS — R0989 Other specified symptoms and signs involving the circulatory and respiratory systems: Secondary | ICD-10-CM

## 2023-10-16 NOTE — Progress Notes (Signed)
 Complete Physical  Assessment and Plan:  Encounter for general adult medical examination with abnormal findings Due annually Health maintenance reviewed  Overweight Discussed appropriate BMI Diet modification. Physical activity. Encouraged/praised to build confidence.   Tobacco use disorder Smoking cessation instruction/counseling given:  counseled patient on the dangers of tobacco use, advised patient to stop smoking, and reviewed strategies to maximize success.     Gastroesophageal reflux disease with esophagitis without hemorrhage Stop Pantoprazole  - Start Pepcid - samples provided. No suspected reflux complications (Barret/stricture). Lifestyle modification:  wt loss, avoid meals 2-3h before bedtime. Consider eliminating food triggers:  chocolate, caffeine, EtOH, acid/spicy food.   Hyperlipidemia, unspecified hyperlipidemia type Discussed lifestyle modifications. Recommended diet heavy in fruits and veggies, omega 3's. Decrease consumption of animal meats, cheeses, and dairy products. Remain active and exercise as tolerated. Continue to monitor. Check lipids/TSH   Vitamin D  deficiency Continue supplement Monitor levels  Other abnormal glucose Education: Reviewed 'ABCs' of diabetes management  Discussed goals to be met and/or maintained include A1C (<7) Blood pressure (<130/80) Cholesterol (LDL <70) Continue Eye Exam yearly  Continue Dental Exam Q6 mo Discussed dietary recommendations Discussed Physical Activity recommendations Foot exam UTD Check A1C   Medication management All medications discussed and reviewed in full. All questions and concerns regarding medications addressed.    Screening for cardiovascular condition EKG  Screening for diabetes mellitus Check and monitor A1c  Screening for thyroid disorder Check and monitor TSH  Screening for hematuria or proteinuria Check and monitor UA/Microalbumin  Nonpalpable dorsalis pedis Smoking  cessation discussed Followed with Podiatry Dr. Joaquim without further recommendation for follow-up.   Possible pedal ultrasound  Orders Placed This Encounter  Procedures   CBC with Differential/Platelet   COMPLETE METABOLIC PANEL WITH GFR   Magnesium    Lipid panel   TSH   Hemoglobin A1c   Insulin , random   VITAMIN D  25 Hydroxy (Vit-D Deficiency, Fractures)   Urinalysis, Routine w reflex microscopic   Microalbumin / creatinine urine ratio   Vitamin B12   EKG 12-Lead   Notify office for further evaluation and treatment, questions or concerns if any reported s/s fail to improve.   The patient was advised to call back or seek an in-person evaluation if any symptoms worsen or if the condition fails to improve as anticipated.   Further disposition pending results of labs. Discussed med's effects and SE's.    I discussed the assessment and treatment plan with the patient. The patient was provided an opportunity to ask questions and all were answered. The patient agreed with the plan and demonstrated an understanding of the instructions.  Discussed med's effects and SE's. Screening labs and tests as requested with regular follow-up as recommended.  I provided 40 minutes of face-to-face time during this encounter including counseling, chart review, and critical decision making was preformed.    Future Appointments  Date Time Provider Department Center  10/18/2024  3:00 PM Laurice President, NP [facility] None    HPI  58 y.o. female  presents for a complete physical. She has DIARRHEA; Family history of colonic polyps; Hyperlipidemia; GERD (gastroesophageal reflux disease); Vitamin D  deficiency; Tobacco use disorder; Other abnormal glucose; and Obesity on their problem list.  Shares that she followed up with Podiatry Dr. Joaquim for ingrown toenail and had surgery 02/2022.  Was told she had absent pedal pulse R>L.  She did not continue to follow up.  She is a current every day smoker. No known hx  of PAD.  Denies pain, BLE edema,  erythema, lack of hair growth.    She tested positive for Covid 07/07/2022 in the ER.  During a hospital follow up with our office, she was sent to ER for evaluation of left leg pain possible blood clot.  She was started on Eliquis at that time however venous US  was  negative for DVT.     She has a hx of RUQ epigastric pain with tenderness.  Associated N/V.  She took Nexium  for 2 weeks and felt relief.  Reports having a ulcer in 2013. She has a hx of GERD. She is feeling better at this time. No longer wanting to take PPI.    Upon review of imaging 08/2015 US  abd revealed a distended gallbladder with 2 non mobile echogenic foci measuring up tot 2 mm most compatible with polyps or non-mobile, non-shadowing stones.  No gallbladder wall thickening, pericholecystic fluids.  She was also noted to have a simple appearing cyst in the left hepatic lobe measuring 1.8 x 1.6 x 1.6 cm and was noted to be slightly larger than on the previous study in 2013.  Imaging also noted the right kidney with a mid-upper pole cyst measuring 4.4 x 2.4 x 3.7 cm.  She denies any urinary symptoms.  She completed a colonoscopy with Dr. Aneita, GI in 06/16/2022 and was advised to re-screen in 3-5 years d/t sesilbe polpys noted. She wa also noted to have an EGD during that time for dx of abd pain, gastritis, duodenitis, and duodenal ulcer.  She then had updated colonoscopy 01/2021 with recall in 5 years, due 2024.  She reports following with Dr. Alonzo Seip [facility]  Last pap 2019.  She is due for mammogram.    BMI is Body mass index is 30.95 kg/m., she has not been working on diet and exercise. Wt Readings from Last 3 Encounters:  10/16/23 163 lb 12.8 oz (74.3 kg)  01/28/23 153 lb (69.4 kg)  11/20/22 154 lb (69.9 kg)   Her blood pressure has been controlled at home, today their BP is BP: (!) 142/82 She does not workout. She denies chest pain, shortness of breath, dizziness.   She is not  on cholesterol medication and denies myalgias. Her cholesterol is not at goal. The cholesterol last visit was:   Lab Results  Component Value Date   CHOL 256 (H) 10/16/2023   HDL 62 10/16/2023   LDLCALC 170 (H) 10/16/2023   TRIG 116 10/16/2023   CHOLHDL 4.1 10/16/2023   She has not been working on diet and exercise for prediabetes, she is not on bASA, she is not on ACE/ARB and denies polydipsia and polyuria. Last A1C in the office was:  Lab Results  Component Value Date   HGBA1C 6.0 (H) 10/16/2023   Last GFR: Lab Results  Component Value Date   EGFR 101 10/16/2023   Patient is on Vitamin D  supplement.   Lab Results  Component Value Date   VD25OH 17 (L) 10/16/2023      Current Medications:  Current Outpatient Medications on File Prior to Visit  Medication Sig Dispense Refill   acetaminophen (TYLENOL) 325 MG tablet Take 650 mg by mouth as needed. (Patient not taking: Reported on 10/16/2023)     pantoprazole  (PROTONIX ) 40 MG tablet TAKE 1 TABLET BY MOUTH EVERY DAY (Patient not taking: Reported on 10/16/2023) 90 tablet 3   No current facility-administered medications on file prior to visit.   Allergies:  No Known Allergies  Medical History:  She has DIARRHEA; Family history  of colonic polyps; Hyperlipidemia; GERD (gastroesophageal reflux disease); Vitamin D  deficiency; Tobacco use disorder; Other abnormal glucose; and Obesity on their problem list.  Health Maintenance:   Immunization History  Administered Date(s) Administered   Tdap 07/29/2011   Health Maintenance  Topic Date Due   COVID-19 Vaccine (1) Never done   Zoster Vaccines- Shingrix (1 of 2) Never done   MAMMOGRAM  10/25/2016   Cervical Cancer Screening (HPV/Pap Cotest)  11/18/2018   DTaP/Tdap/Td (2 - Td or Tdap) 07/28/2021   INFLUENZA VACCINE  Never done   Colonoscopy  01/30/2026   Hepatitis C Screening  Completed   HIV Screening  Completed   HPV VACCINES  Aged Out    LMP: Patient's last menstrual period was  07/24/2014. Pap: 2019 Follows with [facility] MGM: Due  DEXA: N/A  Colonoscopy: 06/2021 5 year recall, Dr. Aneita EGD: 2013  Last Dental Exam: Periodontitis also - Norleen Gainer 2024 Last Eye Exam:  [facility] 2024 Last Derm Exam:  Does not follow - no new concerns.   Patient Care Team: Tonita Fallow, MD as PCP - General (Internal Medicine) Aneita Gwendlyn DASEN, MD as Consulting Physician (Gastroenterology) Sarrah Browning, MD as Consulting Physician (Obstetrics and Gynecology)  Surgical History:  She has a past surgical history that includes Lumbar disc surgery; Cesarean section (1985, 1993); Esophagogastroduodenoscopy (06/03/2012); Colonoscopy (2013); and Upper gastrointestinal endoscopy (11/20/2022). Family History:  Herfamily history includes Colon polyps in her mother; Hemochromatosis in her brother; Multiple sclerosis in her mother. Social History:  She reports that she has been smoking cigarettes. She has a 8.5 pack-year smoking history. She has never used smokeless tobacco. She reports current alcohol use. She reports that she does not use drugs.  Review of Systems: Review of Systems  Constitutional: Negative.   HENT: Negative.    Eyes: Negative.   Respiratory: Negative.    Cardiovascular: Negative.        Absent pedal pulse  Gastrointestinal:  Positive for heartburn.  Genitourinary: Negative.   Musculoskeletal: Negative.   Skin: Negative.   Neurological: Negative.   Endo/Heme/Allergies: Negative.   Psychiatric/Behavioral: Negative.      Physical Exam: Estimated body mass index is 30.95 kg/m as calculated from the following:   Height as of this encounter: 5' 1 (1.549 m).   Weight as of this encounter: 163 lb 12.8 oz (74.3 kg). BP (!) 142/82   Pulse 87   Temp 98.4 F (36.9 C)   Ht 5' 1 (1.549 m)   Wt 163 lb 12.8 oz (74.3 kg)   LMP 07/24/2014   SpO2 97%   BMI 30.95 kg/m   General Appearance: Well nourished, well developed, in no  apparent distress.  Eyes: PERRLA, EOMs, conjunctiva no swelling or erythema, normal fundi and vessels.  Sinuses: No Frontal/maxillary tenderness  ENT/Mouth: Ext aud canals clear, normal light reflex with TMs without erythema, bulging. Good dentition. No erythema, swelling, or exudate on post pharynx. Tonsils not swollen or erythematous. Hearing normal.  Neck: Supple, thyroid normal. No bruits  Respiratory: Respiratory effort normal, BS equal bilaterally without rales, rhonchi, wheezing or stridor.  Cardio: RRR without murmurs, rubs or gallops. Absent pedal pulse bilateral without edema.  Chest: symmetric, with normal excursions and percussion.  Breasts: Symmetric, without lumps, nipple discharge, retractions.  Abdomen: BS x 4 present.  Non-tender to palpation all four quadrants. Lymphatics: Non tender without lymphadenopathy.  Musculoskeletal: Full ROM all peripheral extremities,5/5 strength, and normal gait.  Skin: Warm, dry without rashes, lesions, ecchymosis.  Neuro: Cranial nerves intact, reflexes equal bilaterally. Normal muscle tone, no cerebellar symptoms. Sensation intact.  Psych: Awake and oriented X 3, normal affect, Insight and Judgment appropriate.    EKG: Completed in ED 07/2022 WNL - Defer   BASCOM NECESSARY, NP 10:56 PM [facility]

## 2023-10-17 LAB — LIPID PANEL
Cholesterol: 256 mg/dL — ABNORMAL HIGH (ref <200)
HDL: 62 mg/dL (ref >=50)
LDL Cholesterol (Calc): 170 mg/dL — ABNORMAL HIGH
Non-HDL Cholesterol (Calc): 194 mg/dL — ABNORMAL HIGH (ref <130)
Total CHOL/HDL Ratio: 4.1 (calc) (ref <5.0)
Triglycerides: 116 mg/dL (ref <150)

## 2023-10-17 LAB — COMPLETE METABOLIC PANEL WITH GFR
AG Ratio: 1.8 (calc) (ref 1.0–2.5)
ALT: 16 U/L (ref 6–29)
AST: 16 U/L (ref 10–35)
Albumin: 4.6 g/dL (ref 3.6–5.1)
Alkaline phosphatase (APISO): 86 U/L (ref 37–153)
BUN: 11 mg/dL (ref 7–25)
CO2: 27 mmol/L (ref 20–32)
Calcium: 9.8 mg/dL (ref 8.6–10.4)
Chloride: 105 mmol/L (ref 98–110)
Creat: 0.67 mg/dL (ref 0.50–1.03)
Globulin: 2.6 g/dL (ref 1.9–3.7)
Glucose, Bld: 84 mg/dL (ref 65–99)
Potassium: 4.6 mmol/L (ref 3.5–5.3)
Sodium: 141 mmol/L (ref 135–146)
Total Bilirubin: 0.3 mg/dL (ref 0.2–1.2)
Total Protein: 7.2 g/dL (ref 6.1–8.1)
eGFR: 101 mL/min/{1.73_m2} (ref >=60)

## 2023-10-17 LAB — URINALYSIS, ROUTINE W REFLEX MICROSCOPIC
Bilirubin Urine: NEGATIVE
Glucose, UA: NEGATIVE
Hgb urine dipstick: NEGATIVE
Ketones, ur: NEGATIVE
Leukocytes,Ua: NEGATIVE
Nitrite: NEGATIVE
Protein, ur: NEGATIVE
Specific Gravity, Urine: 1.015 (ref 1.001–1.035)
pH: <=5 (ref 5.0–8.0)

## 2023-10-17 LAB — CBC WITH DIFFERENTIAL/PLATELET
Absolute Lymphocytes: 3284 {cells}/uL (ref 850–3900)
Absolute Monocytes: 869 {cells}/uL (ref 200–950)
Basophils Absolute: 36 {cells}/uL (ref 0–200)
Basophils Relative: 0.3 %
Eosinophils Absolute: 155 {cells}/uL (ref 15–500)
Eosinophils Relative: 1.3 %
HCT: 43.5 % (ref 35.0–45.0)
Hemoglobin: 15 g/dL (ref 11.7–15.5)
MCH: 31.9 pg (ref 27.0–33.0)
MCHC: 34.5 g/dL (ref 32.0–36.0)
MCV: 92.6 fL (ref 80.0–100.0)
MPV: 10.1 fL (ref 7.5–12.5)
Monocytes Relative: 7.3 %
Neutro Abs: 7557 {cells}/uL (ref 1500–7800)
Neutrophils Relative %: 63.5 %
Platelets: 382 10*3/uL (ref 140–400)
RBC: 4.7 10*6/uL (ref 3.80–5.10)
RDW: 12.1 % (ref 11.0–15.0)
Total Lymphocyte: 27.6 %
WBC: 11.9 10*3/uL — ABNORMAL HIGH (ref 3.8–10.8)

## 2023-10-17 LAB — VITAMIN D 25 HYDROXY (VIT D DEFICIENCY, FRACTURES): Vit D, 25-Hydroxy: 17 ng/mL — ABNORMAL LOW (ref 30–100)

## 2023-10-17 LAB — HEMOGLOBIN A1C
Hgb A1c MFr Bld: 6 %{Hb} — ABNORMAL HIGH (ref <5.7)
Mean Plasma Glucose: 126 mg/dL
eAG (mmol/L): 7 mmol/L

## 2023-10-17 LAB — MICROALBUMIN / CREATININE URINE RATIO
Creatinine, Urine: 62 mg/dL (ref 20–275)
Microalb, Ur: <0.2 mg/dL

## 2023-10-17 LAB — MAGNESIUM: Magnesium: 2.3 mg/dL (ref 1.5–2.5)

## 2023-10-17 LAB — INSULIN, RANDOM: Insulin: 12.1 u[IU]/mL

## 2023-10-17 LAB — VITAMIN B12: Vitamin B-12: 548 pg/mL (ref 200–1100)

## 2023-10-17 LAB — TSH: TSH: 1.85 m[IU]/L (ref 0.40–4.50)

## 2023-10-19 NOTE — Patient Instructions (Signed)
Smoking Tobacco Information, Adult Smoking tobacco can be harmful to your health. Tobacco contains a toxic colorless chemical called nicotine. Nicotine causes changes in your brain that make you want more and more. This is called addiction. This can make it hard to stop smoking once you start. Tobacco also has other toxic chemicals that can hurt your body and raise your risk of many cancers. Menthol or "lite" tobacco or cigarette brands are not safer than regular brands. How can smoking tobacco affect me? Smoking tobacco puts you at risk for: Cancer. Smoking is most commonly associated with lung cancer, but can also lead to cancer in other parts of the body. Chronic obstructive pulmonary disease (COPD). This is a long-term lung condition that makes it hard to breathe. It also gets worse over time. High blood pressure (hypertension), heart disease, stroke, heart attack, and lung infections, such as pneumonia. Cataracts. This is when the lenses in the eyes become clouded. Digestive problems. This may include peptic ulcers, heartburn, and gastroesophageal reflux disease (GERD). Oral health problems, such as gum disease, mouth sores, and tooth loss. Loss of taste and smell. Smoking also affects how you look and smell. Smoking may cause: Wrinkles. Yellow or stained teeth, fingers, and fingernails. Bad breath. Bad-smelling clothes and hair. Smoking tobacco can also affect your social life, because: It may be challenging to find places to smoke when away from home. Many workplaces, restaurants, hotels, and public places are tobacco-free. Smoking is expensive. This is due to the cost of tobacco and the long-term costs of treating health problems from smoking. Secondhand smoke may affect those around you. Secondhand smoke can cause lung cancer, breathing problems, and heart disease. Children of smokers have a higher risk for: Sudden infant death syndrome (SIDS). Ear infections. Lung infections. What  actions can I take to prevent health problems? Quit smoking  Do not start smoking. Quit if you already smoke. Do not replace cigarette smoking with vaping devices, such as e-cigarettes. Make a plan to quit smoking and commit to it. Look for programs to help you, and ask your health care provider for recommendations and ideas. Set a date and write down all the reasons you want to quit. Let your friends and family know you are quitting so they can help and support you. Consider finding friends who also want to quit. It can be easier to quit with someone else, so that you can support each other. Talk with your health care provider about using nicotine replacement medicines to help you quit. These include gum, lozenges, patches, sprays, or pills. If you try to quit but return to smoking, stay positive. It is common to slip up when you first quit, so take it one day at a time. Be prepared for cravings. When you feel the urge to smoke, chew gum or suck on hard candy. Lifestyle Stay busy. Take care of your body. Get plenty of exercise, eat a healthy diet, and drink plenty of water. Find ways to manage your stress, such as meditation, yoga, exercise, or time spent with friends and family. Ask your health care provider about having regular tests (screenings) to check for cancer. This may include blood tests, imaging tests, and other tests. Where to find support To get support to quit smoking, consider: Asking your health care provider for more information and resources. Joining a support group for people who want to quit smoking in your local community. There are many effective programs that may help you to quit. Calling the smokefree.gov counselor   helpline at 1-800-QUIT-NOW (1-800-784-8669). Where to find more information You may find more information about quitting smoking from: Centers for Disease Control and Prevention: cdc.gov/tobacco Smokefree.gov: smokefree.gov American [employer] Association:  freedomfromsmoking.org Contact a health care provider if: You have problems breathing. Your lips, nose, or fingers turn blue. You have chest pain. You are coughing up blood. You feel like you will faint. You have other health changes that cause you to worry. Summary Smoking tobacco can negatively affect your health, the health of those around you, your finances, and your social life. Do not start smoking. Quit if you already smoke. If you need help quitting, ask your health care provider. Consider joining a support group for people in your local community who want to quit smoking. There are many effective programs that may help you to quit. This information is not intended to replace advice given to you by your health care provider. Make sure you discuss any questions you have with your health care provider. Document Revised: 09/25/2021 Document Reviewed: 09/25/2021 [employer] Patient Education  2024 [employer]  

## 2023-10-21 ENCOUNTER — Encounter: Payer: Self-pay | Admitting: Nurse Practitioner

## 2023-10-21 ENCOUNTER — Other Ambulatory Visit: Payer: Self-pay | Admitting: Nurse Practitioner

## 2023-10-21 DIAGNOSIS — R0989 Other specified symptoms and signs involving the circulatory and respiratory systems: Secondary | ICD-10-CM

## 2023-10-21 DIAGNOSIS — F17200 Nicotine dependence, unspecified, uncomplicated: Secondary | ICD-10-CM

## 2023-10-24 ENCOUNTER — Ambulatory Visit (HOSPITAL_COMMUNITY)
Admission: RE | Admit: 2023-10-24 | Discharge: 2023-10-24 | Disposition: A | Discharge status: Home / Self Care | Payer: 59 | Source: Ambulatory Visit | Attending: Nurse Practitioner | Admitting: Nurse Practitioner

## 2023-10-24 DIAGNOSIS — F17200 Nicotine dependence, unspecified, uncomplicated: Secondary | ICD-10-CM | POA: Insufficient documentation

## 2023-10-24 DIAGNOSIS — R0989 Other specified symptoms and signs involving the circulatory and respiratory systems: Secondary | ICD-10-CM | POA: Diagnosis present

## 2023-10-24 LAB — VAS US ABI WITH/WO TBI
Left ABI: 1.22
Right ABI: 1.26

## 2024-04-30 ENCOUNTER — Encounter: Payer: Self-pay | Admitting: Advanced Practice Midwife

## 2024-10-18 ENCOUNTER — Encounter: Payer: 59 | Admitting: Nurse Practitioner
